# Patient Record
Sex: Male | Born: 1950 | Race: White | Hispanic: No | Marital: Married | Smoking: Former smoker
Health system: Southern US, Community
[De-identification: ages and names within clinical notes are randomized; demographics above are authoritative.]

## PROBLEM LIST (undated history)

## (undated) DIAGNOSIS — K635 Polyp of colon: Secondary | ICD-10-CM

## (undated) DIAGNOSIS — E785 Hyperlipidemia, unspecified: Secondary | ICD-10-CM

## (undated) DIAGNOSIS — I10 Essential (primary) hypertension: Secondary | ICD-10-CM

## (undated) DIAGNOSIS — F329 Major depressive disorder, single episode, unspecified: Secondary | ICD-10-CM

## (undated) DIAGNOSIS — G4700 Insomnia, unspecified: Secondary | ICD-10-CM

## (undated) DIAGNOSIS — F32A Depression, unspecified: Secondary | ICD-10-CM

## (undated) DIAGNOSIS — K219 Gastro-esophageal reflux disease without esophagitis: Secondary | ICD-10-CM

## (undated) HISTORY — DX: Hyperlipidemia, unspecified: E78.5

## (undated) HISTORY — DX: Essential (primary) hypertension: I10

## (undated) HISTORY — DX: Insomnia, unspecified: G47.00

## (undated) HISTORY — DX: Polyp of colon: K63.5

## (undated) HISTORY — DX: Major depressive disorder, single episode, unspecified: F32.9

## (undated) HISTORY — DX: Depression, unspecified: F32.A

## (undated) HISTORY — DX: Gastro-esophageal reflux disease without esophagitis: K21.9

---

## 2005-05-23 ENCOUNTER — Encounter: Payer: Self-pay | Admitting: Internal Medicine

## 2005-05-27 ENCOUNTER — Encounter: Admission: RE | Admit: 2005-05-27 | Discharge: 2005-05-27 | Payer: Self-pay | Admitting: Internal Medicine

## 2005-09-11 ENCOUNTER — Encounter: Admission: RE | Admit: 2005-09-11 | Discharge: 2005-09-11 | Payer: Self-pay | Admitting: Internal Medicine

## 2006-11-04 ENCOUNTER — Ambulatory Visit: Payer: Self-pay | Admitting: Psychiatry

## 2006-11-10 ENCOUNTER — Ambulatory Visit: Payer: Self-pay | Admitting: Psychiatry

## 2006-11-18 ENCOUNTER — Ambulatory Visit: Payer: Self-pay | Admitting: Psychiatry

## 2006-11-24 ENCOUNTER — Ambulatory Visit: Payer: Self-pay | Admitting: Psychiatry

## 2006-12-01 ENCOUNTER — Ambulatory Visit: Payer: Self-pay | Admitting: Psychiatry

## 2006-12-22 ENCOUNTER — Ambulatory Visit: Payer: Self-pay | Admitting: Psychiatry

## 2006-12-29 ENCOUNTER — Ambulatory Visit: Payer: Self-pay | Admitting: Psychiatry

## 2007-01-12 ENCOUNTER — Ambulatory Visit: Payer: Self-pay | Admitting: Psychiatry

## 2007-01-19 ENCOUNTER — Ambulatory Visit: Payer: Self-pay | Admitting: Psychiatry

## 2007-01-26 ENCOUNTER — Ambulatory Visit: Payer: Self-pay | Admitting: Psychiatry

## 2007-02-02 ENCOUNTER — Ambulatory Visit: Payer: Self-pay | Admitting: Psychiatry

## 2007-02-04 ENCOUNTER — Encounter: Admission: RE | Admit: 2007-02-04 | Discharge: 2007-02-04 | Payer: Self-pay | Admitting: Internal Medicine

## 2007-02-14 HISTORY — PX: TOE AMPUTATION: SHX809

## 2008-01-28 ENCOUNTER — Encounter: Admission: RE | Admit: 2008-01-28 | Discharge: 2008-01-28 | Payer: Self-pay | Admitting: Internal Medicine

## 2009-03-23 ENCOUNTER — Ambulatory Visit: Payer: Self-pay | Admitting: Internal Medicine

## 2009-10-18 IMAGING — CR DG TOE GREAT 2+V*R*
1 series · 1 of 1 positions shown · non-contrast
Comparison: 02/04/07.

CLINICAL DATA: Surgery for osteomyelitis in Tuesday January, 2007.  Now with soft tissue swelling and pain. 
 RIGHT GREAT TOE THREE VIEWS:

[view not recorded]
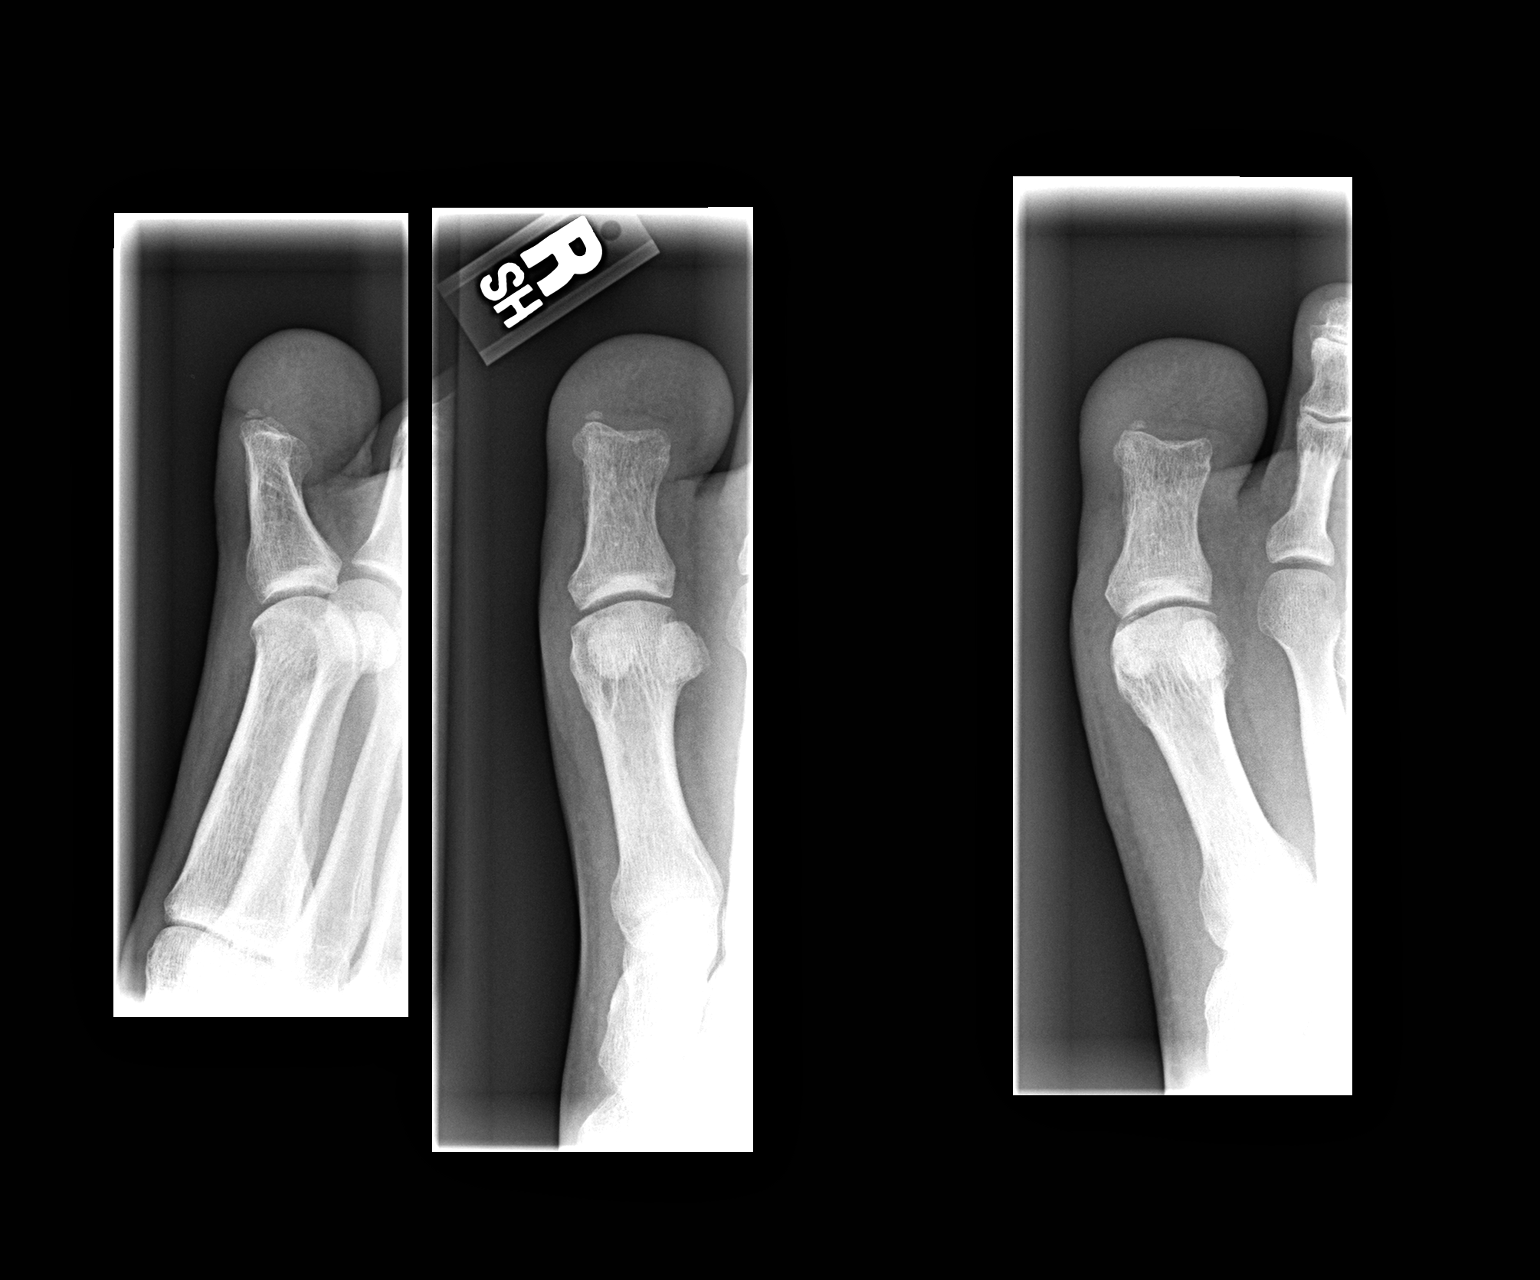

[1 of 1 positions shown; findings below may reference images not displayed]

FINDINGS: Three views of the right toe show amputation of the distal phalanx of the right great toe.  There is soft tissue swelling of the distal right great toe but no bony destruction or erosion is seen.  There is suggestion of perhaps slight demineralization of the distal phalanx and early osteomyelitis cannot be excluded.  Three phase bone scan may be helpful to assess more sensitively in view of the soft tissue swelling present.
IMPRESSION: No definite plain film evidence of osteomyelitis but with faint demineralization and soft tissue swelling early osteomyelitis cannot be excluded.  Consider three phase bone scan.

## 2009-10-27 ENCOUNTER — Ambulatory Visit: Payer: Self-pay | Admitting: Internal Medicine

## 2009-10-30 ENCOUNTER — Ambulatory Visit: Payer: Self-pay | Admitting: Internal Medicine

## 2010-06-04 ENCOUNTER — Ambulatory Visit: Payer: Self-pay | Admitting: Internal Medicine

## 2010-12-06 ENCOUNTER — Ambulatory Visit: Payer: Self-pay | Admitting: Internal Medicine

## 2011-03-21 ENCOUNTER — Ambulatory Visit (INDEPENDENT_AMBULATORY_CARE_PROVIDER_SITE_OTHER): Payer: BC Managed Care – PPO | Admitting: Internal Medicine

## 2011-03-21 DIAGNOSIS — F3289 Other specified depressive episodes: Secondary | ICD-10-CM

## 2011-03-21 DIAGNOSIS — K219 Gastro-esophageal reflux disease without esophagitis: Secondary | ICD-10-CM

## 2011-03-21 DIAGNOSIS — F329 Major depressive disorder, single episode, unspecified: Secondary | ICD-10-CM

## 2011-04-28 ENCOUNTER — Other Ambulatory Visit: Payer: Self-pay | Admitting: Internal Medicine

## 2011-05-06 ENCOUNTER — Encounter: Payer: Self-pay | Admitting: Gastroenterology

## 2011-05-06 ENCOUNTER — Ambulatory Visit (INDEPENDENT_AMBULATORY_CARE_PROVIDER_SITE_OTHER): Payer: BC Managed Care – PPO | Admitting: Gastroenterology

## 2011-05-06 VITALS — BP 148/86 | HR 108 | Ht 73.0 in | Wt 211.0 lb

## 2011-05-06 DIAGNOSIS — K219 Gastro-esophageal reflux disease without esophagitis: Secondary | ICD-10-CM

## 2011-05-06 DIAGNOSIS — Z1211 Encounter for screening for malignant neoplasm of colon: Secondary | ICD-10-CM

## 2011-05-06 MED ORDER — PEG-KCL-NACL-NASULF-NA ASC-C 100 G PO SOLR: 1.0000 | Freq: Once | ORAL | Status: AC

## 2011-05-06 NOTE — Patient Instructions (Addendum)
You have been scheduled for a Upper Endoscopy and Colonoscopy with separate instructions given. Pick up your prep from your pharmacy.  Patient advised to avoid spicy, acidic, citrus, chocolate, mints, fruit and fruit juices.  Limit the intake of caffeine, alcohol and Soda.  Don't exercise too soon after eating.  Don't lie down within 3-4 hours of eating.  Elevate the head of your bed. cc: Sharlet Salina, MD

## 2011-05-06 NOTE — Progress Notes (Signed)
History of Present Illness: This is a 60 year old male who has had intermittent problems with reflux symptoms over the years. Several weeks ago his symptoms substantially worsened and were associated with epigastric pain and heartburn. He was placed on Protonix daily by Dr. Lenord Fellers and his symptoms have substantially improved. He previously underwent colonoscopy 2004 showing multiple hyperplastic colon polyps. He denies nausea, vomiting, weight loss, dysphagia, odynophagia, change in bowel habits, melena, hematochezia.  Past Medical History  Diagnosis Date  . Depression   . Hyperlipidemia   . Insomnia   . Colon polyp   . Hypertension    Past Surgical History  Procedure Date  . Toe amputation 02/2007    partial of right great toe    reports that he has been smoking.  He has never used smokeless tobacco. He reports that he does not drink alcohol or use illicit drugs. family history includes Cancer in his mother and Heart disease in his father. Allergies  Allergen Reactions  . Codeine    Outpatient Encounter Prescriptions as of 05/06/2011  Medication Sig Dispense Refill  . atorvastatin (LIPITOR) 20 MG tablet Take 20 mg by mouth daily.        Marland Kitchen buPROPion (WELLBUTRIN XL) 300 MG 24 hr tablet Take 300 mg by mouth daily.        . diazepam (VALIUM) 10 MG tablet Take 10 mg by mouth every 6 (six) hours as needed.        . pantoprazole (PROTONIX) 40 MG tablet Take 40 mg by mouth daily.        . peg 3350 powder (MOVIPREP) 100 G SOLR Take 1 kit (100 g total) by mouth once.  1 kit  0    ROS: As in HPI. All other systems negative.   Physical Exam: General: Well developed , well nourished, no acute distress Head: Normocephalic and atraumatic Eyes:  sclerae anicteric, EOMI Ears: Normal auditory acuity Mouth: No deformity or lesions Lungs: Clear throughout to auscultation Heart: Regular rate and rhythm; no murmurs, rubs or bruits Abdomen: Soft, non tender and non distended. No masses,  hepatosplenomegaly or hernias noted. Normal Bowel sounds Rectal: Deferred to colonoscopy Musculoskeletal: Symmetrical with no gross deformities  Pulses:  Normal pulses noted Extremities: No clubbing, cyanosis, edema or deformities noted Neurological: Alert oriented x 4, grossly nonfocal Psychological:  Alert and cooperative. Normal mood and affect  Assessment and Recommendations:  1. GERD. Rule out esophagitis and ulcer disease. Standard antireflux measures. Continue Protonix every morning. Schedule upper endoscopy. The risks, benefits, and alternatives to endoscopy with possible biopsy and possible dilation were discussed with the patient and they consent to proceed.   2. Colorectal cancer screening. Multiple hyperplastic colon polyps on last colonoscopy. Schedule colonoscopy. The risks, benefits, and alternatives to colonoscopy with possible biopsy and possible polypectomy were discussed with the patient and they consent to proceed.

## 2011-05-07 ENCOUNTER — Encounter: Payer: Self-pay | Admitting: Gastroenterology

## 2011-05-09 ENCOUNTER — Telehealth: Payer: Self-pay | Admitting: Gastroenterology

## 2011-05-09 MED ORDER — PEG-KCL-NACL-NASULF-NA ASC-C 100 G PO SOLR: 1.0000 | Freq: Once | ORAL | Status: AC

## 2011-05-09 NOTE — Telephone Encounter (Signed)
Movi prep resent to [facility] pharmacy.

## 2011-05-21 ENCOUNTER — Other Ambulatory Visit: Payer: Self-pay

## 2011-05-21 MED ORDER — ATORVASTATIN CALCIUM 20 MG PO TABS: 20.0000 mg | ORAL_TABLET | Freq: Every day | ORAL | Status: DC

## 2011-06-13 ENCOUNTER — Encounter: Payer: BC Managed Care – PPO | Admitting: Gastroenterology

## 2011-06-25 ENCOUNTER — Encounter: Payer: Self-pay | Admitting: Gastroenterology

## 2011-06-25 ENCOUNTER — Ambulatory Visit (AMBULATORY_SURGERY_CENTER): Payer: BC Managed Care – PPO | Admitting: Gastroenterology

## 2011-06-25 DIAGNOSIS — Z86010 Personal history of colonic polyps: Secondary | ICD-10-CM

## 2011-06-25 DIAGNOSIS — K5730 Diverticulosis of large intestine without perforation or abscess without bleeding: Secondary | ICD-10-CM

## 2011-06-25 DIAGNOSIS — K219 Gastro-esophageal reflux disease without esophagitis: Secondary | ICD-10-CM

## 2011-06-25 DIAGNOSIS — D126 Benign neoplasm of colon, unspecified: Secondary | ICD-10-CM

## 2011-06-25 DIAGNOSIS — Z1211 Encounter for screening for malignant neoplasm of colon: Secondary | ICD-10-CM

## 2011-06-25 MED ORDER — SODIUM CHLORIDE 0.9 % IV SOLN: 500.0000 mL | INTRAVENOUS | Status: DC

## 2011-06-25 NOTE — Patient Instructions (Signed)
Dis charge instructions given with verbal understanding. Handouts on polyps , diverticulosis and a hiatal hernia given. Hold aspirin and anything containing aspirin for 2 weeks. Resume previous medications.

## 2011-06-26 ENCOUNTER — Telehealth: Payer: Self-pay

## 2011-06-26 NOTE — Telephone Encounter (Signed)

## 2011-07-02 ENCOUNTER — Encounter: Payer: Self-pay | Admitting: Gastroenterology

## 2011-09-29 ENCOUNTER — Other Ambulatory Visit: Payer: Self-pay | Admitting: Internal Medicine

## 2012-03-28 ENCOUNTER — Other Ambulatory Visit: Payer: Self-pay | Admitting: Internal Medicine

## 2012-04-23 ENCOUNTER — Other Ambulatory Visit: Payer: Self-pay | Admitting: Internal Medicine

## 2012-05-21 ENCOUNTER — Other Ambulatory Visit: Payer: Self-pay | Admitting: Internal Medicine

## 2012-09-17 ENCOUNTER — Ambulatory Visit (INDEPENDENT_AMBULATORY_CARE_PROVIDER_SITE_OTHER): Payer: BC Managed Care – PPO | Admitting: Internal Medicine

## 2012-09-17 ENCOUNTER — Encounter: Payer: Self-pay | Admitting: Internal Medicine

## 2012-09-17 VITALS — BP 148/88 | HR 92 | Ht 71.5 in | Wt 219.0 lb

## 2012-09-17 DIAGNOSIS — F329 Major depressive disorder, single episode, unspecified: Secondary | ICD-10-CM

## 2012-09-17 DIAGNOSIS — K219 Gastro-esophageal reflux disease without esophagitis: Secondary | ICD-10-CM

## 2012-09-17 DIAGNOSIS — E785 Hyperlipidemia, unspecified: Secondary | ICD-10-CM

## 2012-09-17 DIAGNOSIS — G4700 Insomnia, unspecified: Secondary | ICD-10-CM | POA: Insufficient documentation

## 2012-09-17 DIAGNOSIS — Z125 Encounter for screening for malignant neoplasm of prostate: Secondary | ICD-10-CM

## 2012-09-17 DIAGNOSIS — R030 Elevated blood-pressure reading, without diagnosis of hypertension: Secondary | ICD-10-CM

## 2012-09-17 LAB — POCT URINALYSIS DIPSTICK
Blood, UA: NEGATIVE
Glucose, UA: NEGATIVE
Nitrite, UA: NEGATIVE
Protein, UA: NEGATIVE
Spec Grav, UA: >=1.03
pH, UA: 5.5

## 2012-09-17 LAB — CBC WITH DIFFERENTIAL/PLATELET
Basophils Absolute: 0 10*3/uL (ref 0.0–0.1)
Hemoglobin: 15.4 g/dL (ref 13.0–17.0)
MCH: 28.9 pg (ref 26.0–34.0)
MCHC: 34.2 g/dL (ref 30.0–36.0)
MCV: 84.4 fL (ref 78.0–100.0)
Neutro Abs: 5.4 10*3/uL (ref 1.7–7.7)
Neutrophils Relative %: 57 % (ref 43–77)
Platelets: 284 10*3/uL (ref 150–400)
RBC: 5.33 MIL/uL (ref 4.22–5.81)
RDW: 14.7 % (ref 11.5–15.5)

## 2012-09-17 NOTE — Patient Instructions (Addendum)
Continue off Wellbutrin. Keep watch on blood pressure and call me if persistently elevated greater than 140/90.

## 2012-09-18 LAB — COMPREHENSIVE METABOLIC PANEL
AST: 17 U/L (ref 0–37)
BUN: 20 mg/dL (ref 6–23)
CO2: 22 mEq/L (ref 19–32)
Calcium: 9.1 mg/dL (ref 8.4–10.5)
Glucose, Bld: 84 mg/dL (ref 70–99)
Total Bilirubin: 0.4 mg/dL (ref 0.3–1.2)
Total Protein: 6.9 g/dL (ref 6.0–8.3)

## 2012-09-18 LAB — LIPID PANEL: HDL: 33 mg/dL — ABNORMAL LOW (ref >39)

## 2013-02-28 DIAGNOSIS — F32A Depression, unspecified: Secondary | ICD-10-CM | POA: Insufficient documentation

## 2013-02-28 DIAGNOSIS — F329 Major depressive disorder, single episode, unspecified: Secondary | ICD-10-CM | POA: Insufficient documentation

## 2013-02-28 NOTE — Progress Notes (Signed)
  Subjective:    Patient ID: Juan Garcia, male    DOB: September 12, 1951, 62 y.o.   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 Takes l generic Lipitor for hyperlipidemia. Issues with insomnia have been worse recently. Rx given for Zostavax vaccin to be obtained at pharmacy. Declines flu vaccine.  Intolerant of codeine Colonoscopy 2004 by Dr. Russella Dar and repeated in 2012 Tetanus immunization 2004 Hx partial amputation Right great toe due to osteomyelitis 3/08. Had cellulitis from apparent insect bite that progressed to osteomyelitis.  SHx: married. Wife is retired Tourist information centre manager. One adult son. Continues to smoke and has smoked for over 30 years although consumption has decreased. Social alcohol consumption.    Review of Systems  Constitutional: Negative.   HENT: Negative.   Eyes: Negative.   Respiratory: Negative.   Cardiovascular: Negative.   Gastrointestinal:       GERD  Endocrine: Negative.   Genitourinary: Negative.   Allergic/Immunologic: Negative.   Neurological: Negative.   Psychiatric/Behavioral:       Insomnia; history of depression related to post traumatic stress form [place] war       Objective:   Physical Exam  Vitals reviewed. Constitutional: He is oriented to person, place, and time. He appears well-developed and well-nourished. No distress.  HENT:  Head: Normocephalic and atraumatic.  Right Ear: External ear normal.  Left Ear: External ear normal.  Mouth/Throat: Oropharynx is clear and moist.  Eyes: EOM are normal. Pupils are equal, round, and reactive to light. Right eye exhibits no discharge. Left eye exhibits no discharge. No scleral icterus.  Neck: Neck supple. No JVD present. No thyromegaly present.  Cardiovascular:  Normal rate, normal heart sounds and intact distal pulses.   Pulmonary/Chest: Effort normal. No respiratory distress. He has no wheezes. He has no rales. He exhibits no tenderness.  Abdominal: Bowel sounds are normal. He exhibits no distension and no mass. There is no tenderness. There is no rebound and no guarding.  Genitourinary: Prostate normal.  Musculoskeletal: Normal range of motion. He exhibits no edema.  Lymphadenopathy:    He has no cervical adenopathy.  Neurological: He is alert and oriented to person, place, and time. He has normal reflexes. No cranial nerve deficit. Coordination normal.  Skin: Skin is dry. No rash noted. He is not diaphoretic.  Psychiatric: He has a normal mood and affect. His behavior is normal. Judgment and thought content normal.          Assessment & Plan:  Elevated blood pressure- monitor at home and call if persistently elevated Insomnia- Rx for Restoril 30 mg #30 no refill  Depression- no longer taking Wellbutrin feels he does not need it Hyperlipidemia - treated with generic Lipitor recheck in 6 months GERD- treated with generic Protonix  See in 6 months will need lipid panel and liver functions with OV

## 2013-03-23 ENCOUNTER — Encounter: Payer: Self-pay | Admitting: Internal Medicine

## 2013-03-23 ENCOUNTER — Ambulatory Visit (INDEPENDENT_AMBULATORY_CARE_PROVIDER_SITE_OTHER): Payer: Self-pay | Admitting: Internal Medicine

## 2013-03-23 VITALS — BP 156/94 | HR 92 | Wt 208.0 lb

## 2013-03-23 DIAGNOSIS — I10 Essential (primary) hypertension: Secondary | ICD-10-CM

## 2013-03-23 DIAGNOSIS — E785 Hyperlipidemia, unspecified: Secondary | ICD-10-CM

## 2013-03-23 DIAGNOSIS — Z79899 Other long term (current) drug therapy: Secondary | ICD-10-CM

## 2013-03-23 DIAGNOSIS — B351 Tinea unguium: Secondary | ICD-10-CM

## 2013-03-23 DIAGNOSIS — Z23 Encounter for immunization: Secondary | ICD-10-CM

## 2013-03-23 LAB — HEPATIC FUNCTION PANEL
ALT: 12 U/L (ref 0–53)
AST: 14 U/L (ref 0–37)
Albumin: 4.4 g/dL (ref 3.5–5.2)
Alkaline Phosphatase: 111 U/L (ref 39–117)
Bilirubin, Direct: 0.1 mg/dL (ref 0.0–0.3)
Total Bilirubin: 0.5 mg/dL (ref 0.3–1.2)

## 2013-03-23 LAB — LIPID PANEL
Cholesterol: 200 mg/dL (ref 0–200)
LDL Cholesterol: 152 mg/dL — ABNORMAL HIGH (ref 0–99)
Triglycerides: 76 mg/dL (ref <150)
VLDL: 15 mg/dL (ref 0–40)

## 2013-03-23 MED ORDER — TETANUS-DIPHTH-ACELL PERTUSSIS 5-2.5-18.5 LF-MCG/0.5 IM SUSP: 0.5000 mL | Freq: Once | INTRAMUSCULAR | Status: DC

## 2013-03-29 ENCOUNTER — Other Ambulatory Visit: Payer: Self-pay

## 2013-03-29 MED ORDER — PANTOPRAZOLE SODIUM 40 MG PO TBEC: 40.0000 mg | DELAYED_RELEASE_TABLET | Freq: Every day | ORAL | Status: DC

## 2013-04-17 DIAGNOSIS — B351 Tinea unguium: Secondary | ICD-10-CM | POA: Insufficient documentation

## 2013-04-17 NOTE — Patient Instructions (Addendum)
Return in 6 weeks for followup on onychomycosis therapy with CBC and liver functions. Otherwise will need physical examination in 6 months

## 2013-04-17 NOTE — Progress Notes (Addendum)
  Subjective:    Patient ID: Juan Garcia, male    DOB: 1951-08-07, 62 y.o.   MRN: [id number]  HPI In today to followup on depression and hyperlipidemia. 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 Depression is stable. He is fasting today. Fasting lipid panel liver functions drawn a lipid-lowering medication. Today he has a new problem which is onychomycosis of toenails.    Review of Systems     Objective:   Physical Exam Neck is supple without thyromegaly JVD or carotid bruits. Chest clear to auscultation. Cardiac exam regular rate and rhythm normal S1 and S2. Extremities without edema. Onychomycosis of toenails      Assessment & Plan:  Depression  Hyperlipidemia  Onychomycosis of toenails  Hypertension  Plan: Start amlodipine 5 mg daily for hypertension and return in 4 weeks. Continue statin medication and Wellbutrin for depression.  Lamisil 250 mg daily for 6 weeks. Must return then for CBC and liver functions and office visit. Told him it would take 12 weeks of Lamisil to see an improvement in only about 80% of patients respond to Lamisil.

## 2013-04-27 ENCOUNTER — Ambulatory Visit: Payer: BC Managed Care – PPO | Admitting: Internal Medicine

## 2013-04-29 ENCOUNTER — Ambulatory Visit (INDEPENDENT_AMBULATORY_CARE_PROVIDER_SITE_OTHER): Payer: BC Managed Care – PPO | Admitting: Internal Medicine

## 2013-04-29 ENCOUNTER — Encounter: Payer: Self-pay | Admitting: Internal Medicine

## 2013-04-29 VITALS — BP 140/74 | Temp 99.0°F | Wt 206.0 lb

## 2013-04-29 DIAGNOSIS — I10 Essential (primary) hypertension: Secondary | ICD-10-CM

## 2013-04-29 DIAGNOSIS — Z79899 Other long term (current) drug therapy: Secondary | ICD-10-CM

## 2013-04-29 LAB — HEPATIC FUNCTION PANEL
ALT: 24 U/L (ref 0–53)
AST: 17 U/L (ref 0–37)
Albumin: 3.7 g/dL (ref 3.5–5.2)
Alkaline Phosphatase: 96 U/L (ref 39–117)
Total Protein: 6.7 g/dL (ref 6.0–8.3)

## 2013-04-29 LAB — CBC WITH DIFFERENTIAL/PLATELET
Eosinophils Absolute: 0.1 10*3/uL (ref 0.0–0.7)
Eosinophils Relative: 1 % (ref 0–5)
Hemoglobin: 13.5 g/dL (ref 13.0–17.0)
Lymphs Abs: 2.4 10*3/uL (ref 0.7–4.0)
MCH: 29 pg (ref 26.0–34.0)
MCV: 82.2 fL (ref 78.0–100.0)
Monocytes Absolute: 0.9 10*3/uL (ref 0.1–1.0)
Monocytes Relative: 7 % (ref 3–12)
RBC: 4.66 MIL/uL (ref 4.22–5.81)

## 2013-05-02 DIAGNOSIS — I10 Essential (primary) hypertension: Secondary | ICD-10-CM | POA: Insufficient documentation

## 2013-05-02 NOTE — Addendum Note (Signed)
Addended by: Margaree Mackintosh on: 05/02/2013 04:09 PM   Modules accepted: Medications

## 2013-05-02 NOTE — Patient Instructions (Addendum)
Continue Lamisil 250 mg daily to complete a 12 week course. For bronchitis, Levaquin 500 milligrams daily for 7 days. Continue amlodipine 5 mg daily. Start Cozaar 50 mg daily and return in 4 -6 weeks

## 2013-05-02 NOTE — Progress Notes (Signed)
  Subjective:    Patient ID: Juan Garcia, male    DOB: 01-17-1951, 62 y.o.   MRN: [id number]  HPI Was started at last visit on amlodipine 5 mg daily for hypertension. Blood pressure has improved but is still elevated. If I  increase his amlodipine, his feet might  swell. 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 With regard to onychomycosis, Lamisil seems to be working. Went ahead and drew CBC and liver functions today in followup of Lamisil therapy. Also he took a group of children to [place] for a field trip and came back with a cough that has persisted. Cough is productive. No fever or shaking chills    Review of Systems     Objective:   Physical Exam toenails are beginning to grow out and seem to be normal at site of new growth. Chest is clear to auscultation without rales or wheezing. Neck is supple without JVD thyromegaly or carotid bruits. Cardiac exam regular rate and rhythm normal S1 and S2. Extremities without edema.        Assessment & Plan:  Bronchitis  Hypertension  Onychomycosis  Plan: Continue Lamisil 250 mg daily to complete a total treatment of 3 months. For bronchitis, Levaquin 500 milligrams daily for 7 days. For hypertension, continue amlodipine 5 mg daily and add Cozaar 50 mg daily. Return in 4-6 weeks for blood pressure check and basic metabolic panel   spent 25 minutes with patient evaluated in 3 medical issues

## 2013-05-31 ENCOUNTER — Ambulatory Visit (INDEPENDENT_AMBULATORY_CARE_PROVIDER_SITE_OTHER): Payer: BC Managed Care – PPO | Admitting: Internal Medicine

## 2013-05-31 ENCOUNTER — Encounter: Payer: Self-pay | Admitting: Internal Medicine

## 2013-05-31 VITALS — BP 132/84 | HR 84 | Wt 208.0 lb

## 2013-05-31 DIAGNOSIS — I10 Essential (primary) hypertension: Secondary | ICD-10-CM

## 2013-06-01 LAB — BASIC METABOLIC PANEL
CO2: 24 mEq/L (ref 19–32)
Calcium: 9.1 mg/dL (ref 8.4–10.5)
Creat: 0.67 mg/dL (ref 0.50–1.35)

## 2013-06-24 ENCOUNTER — Encounter: Payer: Self-pay | Admitting: Internal Medicine

## 2013-06-24 ENCOUNTER — Ambulatory Visit (INDEPENDENT_AMBULATORY_CARE_PROVIDER_SITE_OTHER): Payer: BC Managed Care – PPO | Admitting: Internal Medicine

## 2013-06-24 VITALS — BP 126/78 | HR 80 | Wt 211.0 lb

## 2013-06-24 DIAGNOSIS — B351 Tinea unguium: Secondary | ICD-10-CM

## 2013-06-24 DIAGNOSIS — I10 Essential (primary) hypertension: Secondary | ICD-10-CM

## 2013-06-24 DIAGNOSIS — Z79899 Other long term (current) drug therapy: Secondary | ICD-10-CM

## 2013-06-24 LAB — HEPATIC FUNCTION PANEL
AST: 16 U/L (ref 0–37)
Albumin: 4.5 g/dL (ref 3.5–5.2)
Alkaline Phosphatase: 101 U/L (ref 39–117)
Total Bilirubin: 0.4 mg/dL (ref 0.3–1.2)

## 2013-06-24 LAB — CBC WITH DIFFERENTIAL/PLATELET
Basophils Absolute: 0 10*3/uL (ref 0.0–0.1)
Basophils Relative: 0 % (ref 0–1)
Hemoglobin: 14.7 g/dL (ref 13.0–17.0)
MCHC: 34.2 g/dL (ref 30.0–36.0)
Monocytes Relative: 7 % (ref 3–12)
Neutro Abs: 5.9 10*3/uL (ref 1.7–7.7)
Neutrophils Relative %: 59 % (ref 43–77)
RBC: 5.22 MIL/uL (ref 4.22–5.81)

## 2013-06-26 ENCOUNTER — Encounter: Payer: Self-pay | Admitting: Internal Medicine

## 2013-06-26 NOTE — Progress Notes (Signed)
  Subjective:    Patient ID: Juan Garcia, male    DOB: 10/05/1951, 62 y.o.   MRN: [id number]  HPI   At last  visit losartan was increased from 50 to 100 mg daily. Blood pressure is better controlled with this regimen. No complaints or problems on this medication. Feels well. Just got back from the beach. Feels relaxed. He is taking Lamisil for onychomycosis.    Review of Systems     Objective:   Physical Exam Neck is supple without JVD thyromegaly or carotid bruits. Chest clear to auscultation. Cardiac exam regular rate and rhythm normal S1 and S2. Extremities without edema.        Assessment & Plan:  Hypertension-better controlled with losartan 100 mg daily and amlodipine 5 mg daily  Plan: Return Fall 2014 for physical examination.

## 2013-06-26 NOTE — Patient Instructions (Addendum)
Continue losartan 100 mg daily and amlodipine 5 mg daily. Return Fall 2014 for physical exam

## 2013-06-26 NOTE — Patient Instructions (Addendum)
Continue amlodipine 5 mg daily. Increased losartan from 50-100 mg daily and return in 4 weeks.

## 2013-06-26 NOTE — Progress Notes (Signed)
  Subjective:    Patient ID: Juan Garcia, male    DOB: Aug 11, 1951, 62 y.o.   MRN: [id number]  HPI 62 year old male in today for followup of hypertension. Is on amlodipine 5 mg daily and losartan 50 mg daily. Blood pressure control could be better. B-met is within normal limits. No problems with these 2 medications.    Review of Systems     Objective:   Physical Exam Skin is warm and dry. Nodes none. Neck supple without thyromegaly. No JVD or carotid bruits. Chest clear to auscultation. Cardiac exam regular rate and rhythm normal S1 and S2. Extremities without edema.       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 Continue amlodipine 5 mg daily. Return in 4 weeks.

## 2013-08-19 ENCOUNTER — Other Ambulatory Visit: Payer: Self-pay

## 2013-08-19 MED ORDER — LOSARTAN POTASSIUM 50 MG PO TABS: 50.0000 mg | ORAL_TABLET | Freq: Every day | ORAL | Status: DC

## 2013-08-25 ENCOUNTER — Telehealth: Payer: Self-pay | Admitting: Internal Medicine

## 2013-08-26 ENCOUNTER — Telehealth: Payer: Self-pay | Admitting: Internal Medicine

## 2013-08-26 MED ORDER — BUPROPION HCL ER (XL) 300 MG PO TB24: 300.0000 mg | ORAL_TABLET | Freq: Every day | ORAL | Status: DC

## 2013-08-26 NOTE — Telephone Encounter (Signed)
Patient called back to inquire on Wellbutrin prescription.  Dr. Lenord Fellers advised she has just sent e-scribe to his pharmacy.

## 2013-08-26 NOTE — Telephone Encounter (Signed)
It was ordered at last visit. What is the issue?

## 2013-08-26 NOTE — Telephone Encounter (Signed)
Pt's wife calls and asks he be restarted on Wellbutrin. At last visit, he asked to restrat it for depression. I thought it had been ordered. Will re- order today.

## 2013-08-27 NOTE — Telephone Encounter (Signed)
Dr Lenord Fellers e-scribed for patient.

## 2013-09-21 ENCOUNTER — Other Ambulatory Visit: Payer: Self-pay

## 2013-09-21 MED ORDER — ATORVASTATIN CALCIUM 20 MG PO TABS: 20.0000 mg | ORAL_TABLET | Freq: Every day | ORAL | Status: DC

## 2013-09-23 ENCOUNTER — Other Ambulatory Visit: Payer: Self-pay

## 2013-09-23 MED ORDER — AMLODIPINE BESYLATE 5 MG PO TABS: 5.0000 mg | ORAL_TABLET | Freq: Every day | ORAL | Status: DC

## 2013-09-30 ENCOUNTER — Ambulatory Visit: Payer: BC Managed Care – PPO | Admitting: Internal Medicine

## 2013-12-03 ENCOUNTER — Other Ambulatory Visit: Payer: Self-pay | Admitting: Internal Medicine

## 2013-12-03 DIAGNOSIS — Z13220 Encounter for screening for lipoid disorders: Secondary | ICD-10-CM

## 2013-12-03 DIAGNOSIS — N400 Enlarged prostate without lower urinary tract symptoms: Secondary | ICD-10-CM

## 2013-12-03 DIAGNOSIS — Z0000 Encounter for general adult medical examination without abnormal findings: Secondary | ICD-10-CM

## 2013-12-03 DIAGNOSIS — Z130 Encounter for screening for diseases of the blood and blood-forming organs and certain disorders involving the immune mechanism: Secondary | ICD-10-CM

## 2013-12-07 ENCOUNTER — Ambulatory Visit: Payer: BC Managed Care – PPO | Admitting: Internal Medicine

## 2013-12-13 ENCOUNTER — Other Ambulatory Visit: Payer: BC Managed Care – PPO | Admitting: Internal Medicine

## 2013-12-13 DIAGNOSIS — Z79899 Other long term (current) drug therapy: Secondary | ICD-10-CM

## 2013-12-13 DIAGNOSIS — I10 Essential (primary) hypertension: Secondary | ICD-10-CM

## 2013-12-13 DIAGNOSIS — E785 Hyperlipidemia, unspecified: Secondary | ICD-10-CM

## 2013-12-13 LAB — BASIC METABOLIC PANEL
BUN: 14 mg/dL (ref 6–23)
Creat: 0.9 mg/dL (ref 0.50–1.35)
Potassium: 4.3 mEq/L (ref 3.5–5.3)

## 2013-12-13 LAB — LIPID PANEL
LDL Cholesterol: 131 mg/dL — ABNORMAL HIGH (ref 0–99)
Total CHOL/HDL Ratio: 5 Ratio
VLDL: 17 mg/dL (ref 0–40)

## 2013-12-13 LAB — HEPATIC FUNCTION PANEL
AST: 16 U/L (ref 0–37)
Bilirubin, Direct: 0.1 mg/dL (ref 0.0–0.3)
Total Bilirubin: 0.4 mg/dL (ref 0.3–1.2)

## 2013-12-14 ENCOUNTER — Encounter: Payer: Self-pay | Admitting: Internal Medicine

## 2013-12-14 ENCOUNTER — Ambulatory Visit (INDEPENDENT_AMBULATORY_CARE_PROVIDER_SITE_OTHER): Payer: BC Managed Care – PPO | Admitting: Internal Medicine

## 2013-12-14 VITALS — BP 128/80 | HR 76 | Temp 98.6°F | Wt 208.0 lb

## 2013-12-14 DIAGNOSIS — I10 Essential (primary) hypertension: Secondary | ICD-10-CM

## 2013-12-14 DIAGNOSIS — E785 Hyperlipidemia, unspecified: Secondary | ICD-10-CM

## 2013-12-14 NOTE — Patient Instructions (Addendum)
Continue diet and exercise. Return summer 2015 for physical exam. Continue same medications

## 2013-12-14 NOTE — Progress Notes (Signed)
   Subjective:    Patient ID: Juan Garcia, male    DOB: 02-Aug-1951, 62 y.o.   MRN: [id number]  HPI  In today for followup of hypertension and hyperlipidemia. Declines take flu shot. Has lost 3 pounds since July. Says he's been eating less. Feels better since she's lost a bit of weight. Says he's actually lost more than what we have recorded recently. In October 2013 he weighed 219 pounds. Now weighs 208 pounds.    Review of Systems     Objective:   Physical Exam Neck is supple without JVD thyromegaly or carotid bruits. Chest clear to auscultation. Cardiac exam regular rate and rhythm normal S1 and S2. Extremities without edema        Assessment & Plan:  Hypertension  Hyperlipidemia  Plan: B- met stable. Lipids have improved. Liver functions within normal limits. Lab results reviewed with him today. Continue same medication return for physical exam Summer 2015

## 2014-02-11 ENCOUNTER — Other Ambulatory Visit: Payer: Self-pay

## 2014-02-11 MED ORDER — BUPROPION HCL ER (XL) 300 MG PO TB24: 300.0000 mg | ORAL_TABLET | Freq: Every day | ORAL | Status: DC

## 2014-03-08 ENCOUNTER — Other Ambulatory Visit: Payer: Self-pay

## 2014-03-08 MED ORDER — ATORVASTATIN CALCIUM 20 MG PO TABS: 20.0000 mg | ORAL_TABLET | Freq: Every day | ORAL | Status: DC

## 2014-03-08 MED ORDER — LOSARTAN POTASSIUM 50 MG PO TABS: 50.0000 mg | ORAL_TABLET | Freq: Every day | ORAL | Status: DC

## 2014-03-08 MED ORDER — AMLODIPINE BESYLATE 5 MG PO TABS: 5.0000 mg | ORAL_TABLET | Freq: Every day | ORAL | Status: DC

## 2014-04-19 ENCOUNTER — Encounter: Payer: Self-pay | Admitting: Gastroenterology

## 2014-06-21 ENCOUNTER — Encounter (INDEPENDENT_AMBULATORY_CARE_PROVIDER_SITE_OTHER): Payer: Medicare Other | Admitting: Internal Medicine

## 2014-06-21 ENCOUNTER — Encounter: Payer: Self-pay | Admitting: Internal Medicine

## 2014-06-21 VITALS — BP 140/86 | HR 76 | Temp 97.3°F | Ht 72.25 in | Wt 210.0 lb

## 2014-06-21 DIAGNOSIS — Z0000 Encounter for general adult medical examination without abnormal findings: Secondary | ICD-10-CM | POA: Diagnosis not present

## 2014-06-21 LAB — POCT URINALYSIS DIPSTICK
BILIRUBIN UA: NEGATIVE
Glucose, UA: NEGATIVE
Ketones, UA: NEGATIVE
Leukocytes, UA: NEGATIVE
Nitrite, UA: NEGATIVE
Protein, UA: NEGATIVE
RBC UA: NEGATIVE
SPEC GRAV UA: 1.015
Urobilinogen, UA: NEGATIVE
pH, UA: 6.5

## 2014-09-11 NOTE — Progress Notes (Signed)
   Subjective:    Patient ID: Juan Garcia, male    DOB: Apr 10, 1951, 63 y.o.   MRN: [id number]  HPI Appt was canceled because patient does not have Medicare coverage worked out    Review of Systems     Objective:   Physical Exam        Assessment & Plan:

## 2014-09-26 ENCOUNTER — Encounter: Payer: Self-pay | Admitting: Internal Medicine

## 2014-09-26 ENCOUNTER — Other Ambulatory Visit: Payer: Self-pay

## 2014-09-26 ENCOUNTER — Ambulatory Visit (INDEPENDENT_AMBULATORY_CARE_PROVIDER_SITE_OTHER): Payer: BC Managed Care – PPO | Admitting: Internal Medicine

## 2014-09-26 ENCOUNTER — Other Ambulatory Visit: Payer: Medicare Other | Admitting: Internal Medicine

## 2014-09-26 VITALS — BP 130/78 | HR 88 | Ht 73.0 in | Wt 215.0 lb

## 2014-09-26 DIAGNOSIS — Z125 Encounter for screening for malignant neoplasm of prostate: Secondary | ICD-10-CM | POA: Diagnosis not present

## 2014-09-26 DIAGNOSIS — Z23 Encounter for immunization: Secondary | ICD-10-CM

## 2014-09-26 DIAGNOSIS — Z8659 Personal history of other mental and behavioral disorders: Secondary | ICD-10-CM

## 2014-09-26 DIAGNOSIS — R030 Elevated blood-pressure reading, without diagnosis of hypertension: Secondary | ICD-10-CM | POA: Diagnosis not present

## 2014-09-26 DIAGNOSIS — Z0000 Encounter for general adult medical examination without abnormal findings: Secondary | ICD-10-CM | POA: Diagnosis not present

## 2014-09-26 DIAGNOSIS — E785 Hyperlipidemia, unspecified: Secondary | ICD-10-CM

## 2014-09-26 MED ORDER — BUPROPION HCL ER (XL) 300 MG PO TB24: 300.0000 mg | ORAL_TABLET | Freq: Every day | ORAL | Status: DC

## 2014-09-26 MED ORDER — AMLODIPINE BESYLATE 5 MG PO TABS: 5.0000 mg | ORAL_TABLET | Freq: Every day | ORAL | Status: DC

## 2014-09-26 MED ORDER — LOSARTAN POTASSIUM 50 MG PO TABS: 50.0000 mg | ORAL_TABLET | Freq: Every day | ORAL | Status: DC

## 2014-09-26 MED ORDER — PNEUMOCOCCAL VAC POLYVALENT 25 MCG/0.5ML IJ INJ: 0.5000 mL | INJECTION | Freq: Once | INTRAMUSCULAR | Status: DC

## 2014-09-26 MED ORDER — ATORVASTATIN CALCIUM 20 MG PO TABS: 20.0000 mg | ORAL_TABLET | Freq: Every day | ORAL | Status: DC

## 2014-09-26 NOTE — Patient Instructions (Addendum)
Continue same medications and return in 6 months. Pneumonia and flu vaccines given today. Order given for shingles vaccine at pharmacy

## 2014-09-27 LAB — CBC WITH DIFFERENTIAL/PLATELET
BASOS PCT: 0 % (ref 0–1)
Basophils Absolute: 0 10*3/uL (ref 0.0–0.1)
EOS PCT: 3 % (ref 0–5)
Eosinophils Absolute: 0.3 10*3/uL (ref 0.0–0.7)
HCT: 45 % (ref 39.0–52.0)
Hemoglobin: 15.7 g/dL (ref 13.0–17.0)
LYMPHS ABS: 2.9 10*3/uL (ref 0.7–4.0)
Lymphocytes Relative: 32 % (ref 12–46)
MCH: 29.2 pg (ref 26.0–34.0)
MCHC: 34.9 g/dL (ref 30.0–36.0)
MCV: 83.6 fL (ref 78.0–100.0)
MONO ABS: 0.7 10*3/uL (ref 0.1–1.0)
Monocytes Relative: 8 % (ref 3–12)
Neutro Abs: 5.2 10*3/uL (ref 1.7–7.7)
Neutrophils Relative %: 57 % (ref 43–77)
Platelets: 269 10*3/uL (ref 150–400)
RBC: 5.38 MIL/uL (ref 4.22–5.81)
RDW: 14.8 % (ref 11.5–15.5)
WBC: 9.1 10*3/uL (ref 4.0–10.5)

## 2014-09-27 LAB — COMPREHENSIVE METABOLIC PANEL
ALK PHOS: 113 U/L (ref 39–117)
ALT: 15 U/L (ref 0–53)
AST: 15 U/L (ref 0–37)
Albumin: 4.2 g/dL (ref 3.5–5.2)
BILIRUBIN TOTAL: 0.4 mg/dL (ref 0.2–1.2)
BUN: 12 mg/dL (ref 6–23)
CO2: 22 mEq/L (ref 19–32)
CREATININE: 0.86 mg/dL (ref 0.50–1.35)
Calcium: 9.1 mg/dL (ref 8.4–10.5)
Chloride: 107 mEq/L (ref 96–112)
Glucose, Bld: 100 mg/dL — ABNORMAL HIGH (ref 70–99)
Potassium: 4.5 mEq/L (ref 3.5–5.3)
SODIUM: 141 meq/L (ref 135–145)
Total Protein: 6.9 g/dL (ref 6.0–8.3)

## 2014-09-27 LAB — LIPID PANEL
CHOL/HDL RATIO: 6 ratio
Cholesterol: 215 mg/dL — ABNORMAL HIGH (ref 0–200)
HDL: 36 mg/dL — ABNORMAL LOW (ref >39)
LDL CALC: 158 mg/dL — AB (ref 0–99)
Triglycerides: 106 mg/dL (ref <150)
VLDL: 21 mg/dL (ref 0–40)

## 2014-09-27 LAB — PSA: PSA: 3.21 ng/mL (ref <=4.00)

## 2014-11-13 NOTE — Progress Notes (Signed)
Subjective:    Patient ID: Juan Garcia, male    DOB: 09/24/1951, 63 y.o.   MRN: [id number]  HPI 63 year old white male in today for Welcome to Medicare examination. History of depression and GE reflux. Takes Lipitor for hyperlipidemia. Issues with insomnia.  Intolerant of codeine. Colonoscopy 2012 by Dr. Fuller Plan.  History of partial dictation right great toe due to osteomyelitis 2008. Had cellulitis from apparent insect bite that progressed osteomyelitis.  Social history: Wife is a retired Chief Technology Officer. He is a retired Radio producer. One adult son. Continues to smoke and is smoked for over 30 years although consumption has decreased. Social alcohol consumption.  Family history: Mother died of cancer at age 98. Father with history of triple bypass surgery died of congestive heart failure at 67. 2 sisters both overweight. No brothers.    Review of Systems history of depression related to posttraumatic stress from [place] War and insomnia. GERD. Otherwise negative     Objective:   Physical Exam  Constitutional: He is oriented to person, place, and time. He appears well-developed and well-nourished.  HENT:  Head: Normocephalic.  Right Ear: External ear normal.  Left Ear: External ear normal.  Mouth/Throat: Oropharynx is clear and moist. No oropharyngeal exudate.  Eyes: Conjunctivae and EOM are normal. Pupils are equal, round, and reactive to light. Right eye exhibits no discharge. Left eye exhibits no discharge. No scleral icterus.  Neck: Neck supple. No JVD present. No thyromegaly present.  Cardiovascular: Normal rate, normal heart sounds and intact distal pulses.   No murmur heard. Pulmonary/Chest: Effort normal and breath sounds normal. He has no wheezes. He has no rales.  Abdominal: Soft. Bowel sounds are normal. He exhibits no distension and no mass. There is no tenderness. There is no rebound and no guarding.  Genitourinary: Rectum normal and prostate normal.    Musculoskeletal: Normal range of motion. He exhibits no edema.  Lymphadenopathy:    He has no cervical adenopathy.  Neurological: He is alert and oriented to person, place, and time. He has normal reflexes. He displays normal reflexes. No cranial nerve deficit. Coordination normal.  Skin: Skin is warm and dry.  Onychomycosis  Psychiatric: He has a normal mood and affect. His behavior is normal. Judgment and thought content normal.  Vitals reviewed.         Assessment & Plan:  GE reflux  Insomnia  Depression  Hypertension  Toenails fungus-improved with Lamisil  Hyperlipidemia  Plan: Flu vaccine given. Continue same medications and return in 6 months. The above medical problems are stable on medication.  Subjective:   Patient presents for Medicare Annual/Subsequent preventive examination.  Review Past Medical/Family/Social: See above   Risk Factors  Current exercise habits: Stays active about his home Dietary issues discussed: Low fat, low car  Cardiac risk factors: Hypertension, hyperlipidemia  Depression Screen  (Note: if answer to either of the following is "Yes", a more complete depression screening is indicated)   Over the past two weeks, have you felt down, depressed or hopeless? No  Over the past two weeks, have you felt little interest or pleasure in doing things? No Have you lost interest or pleasure in daily life? No Do you often feel hopeless? No Do you cry easily over simple problems? No   Activities of Daily Living  In your present state of health, do you have any difficulty performing the following activities?:   Driving? No  Managing money? No  Feeding yourself? No  Getting from bed to chair?  No  Climbing a flight of stairs? No  Preparing food and eating?: No  Bathing or showering? No  Getting dressed: No  Getting to the toilet? No  Using the toilet:No  Moving around from place to place: No  In the past year have you fallen or had a near  fall?:No  Are you sexually active? yes Do you have more than one partner? No   Hearing Difficulties: No  Do you often ask people to speak up or repeat themselves? No  Do you experience ringing or noises in your ears? No  Do you have difficulty understanding soft or whispered voices? No  Do you feel that you have a problem with memory? No Do you often misplace items? No    Home Safety:  Do you have a smoke alarm at your residence? Yes Do you have grab bars in the bathroom? No Do you have throw rugs in your house? No   Cognitive Testing  Alert? Yes Normal Appearance?Yes  Oriented to person? Yes Place? Yes  Time? Yes  Recall of three objects? Yes  Can perform simple calculations? Yes  Displays appropriate judgment?Yes  Can read the correct time from a watch face?Yes   List the Names of Other Physician/Practitioners you currently use:  See referral list for the physicians patient is currently seeing.  Dr. Fuller Plan for colonoscopy   Review of Systems: See above   Objective:     General appearance: Appears stated age Head: Normocephalic, without obvious abnormality, atraumatic  Eyes: conj clear, EOMi PEERLA  Ears: normal TM's and external ear canals both ears  Nose: Nares normal. Septum midline. Mucosa normal. No drainage or sinus tenderness.  Throat: lips, mucosa, and tongue normal; teeth and gums normal  Neck: no adenopathy, no carotid bruit, no JVD, supple, symmetrical, trachea midline and thyroid not enlarged, symmetric, no tenderness/mass/nodules  No CVA tenderness.  Lungs: clear to auscultation bilaterally  Breasts: normal appearance, no masses or tenderness Heart: regular rate and rhythm, S1, S2 normal, no murmur, click, rub or gallop  Abdomen: soft, non-tender; bowel sounds normal; no masses, no organomegaly  Musculoskeletal: ROM normal in all joints, no crepitus, no deformity, Normal muscle strengthen. Back  is symmetric, no curvature. Skin: Skin color, texture,  turgor normal. No rashes or lesions  Lymph nodes: Cervical, supraclavicular, and axillary nodes normal.  Neurologic: CN 2 -12 Normal, Normal symmetric reflexes. Normal coordination and gait  Psych: Alert & Oriented x 3, Mood appear stable.    Assessment:    Annual wellness medicare exam   Plan:    During the course of the visit the patient was educated and counseled about appropriate screening and preventive services including:   Annual PSA Pneumovax and flu vaccines given today. Order given to have shingles vaccine at pharmacy    Patient Instructions (the written plan) was given to the patient.  Medicare Attestation  I have personally reviewed:  The patient's medical and social history  Their use of alcohol, tobacco or illicit drugs  Their current medications and supplements  The patient's functional ability including ADLs,fall risks, home safety risks, cognitive, and hearing and visual impairment  Diet and physical activities  Evidence for depression or mood disorders  The patient's weight, height, BMI, and visual acuity have been recorded in the chart. I have made referrals, counseling, and provided education to the patient based on review of the above and I have provided the patient with a written personalized care plan for preventive services.

## 2015-02-06 ENCOUNTER — Other Ambulatory Visit: Payer: Self-pay | Admitting: Internal Medicine

## 2015-03-09 ENCOUNTER — Ambulatory Visit: Payer: Medicare Other | Admitting: Internal Medicine

## 2015-03-17 ENCOUNTER — Other Ambulatory Visit: Payer: Medicare PPO | Admitting: Internal Medicine

## 2015-03-17 DIAGNOSIS — Z79899 Other long term (current) drug therapy: Secondary | ICD-10-CM

## 2015-03-17 DIAGNOSIS — E785 Hyperlipidemia, unspecified: Secondary | ICD-10-CM

## 2015-03-17 LAB — LIPID PANEL
CHOL/HDL RATIO: 6.1 ratio
Cholesterol: 214 mg/dL — ABNORMAL HIGH (ref 0–200)
HDL: 35 mg/dL — ABNORMAL LOW (ref >=40)
LDL CALC: 158 mg/dL — AB (ref 0–99)
Triglycerides: 103 mg/dL (ref <150)
VLDL: 21 mg/dL (ref 0–40)

## 2015-03-17 LAB — HEPATIC FUNCTION PANEL
ALK PHOS: 108 U/L (ref 39–117)
ALT: 21 U/L (ref 0–53)
AST: 18 U/L (ref 0–37)
Albumin: 4 g/dL (ref 3.5–5.2)
BILIRUBIN DIRECT: 0.1 mg/dL (ref 0.0–0.3)
Indirect Bilirubin: 0.3 mg/dL (ref 0.2–1.2)
TOTAL PROTEIN: 7 g/dL (ref 6.0–8.3)
Total Bilirubin: 0.4 mg/dL (ref 0.2–1.2)

## 2015-03-20 ENCOUNTER — Other Ambulatory Visit: Payer: Self-pay | Admitting: Internal Medicine

## 2015-03-23 ENCOUNTER — Encounter: Payer: Self-pay | Admitting: Internal Medicine

## 2015-03-23 ENCOUNTER — Ambulatory Visit (INDEPENDENT_AMBULATORY_CARE_PROVIDER_SITE_OTHER): Payer: Medicare PPO | Admitting: Internal Medicine

## 2015-03-23 VITALS — BP 136/80 | HR 92 | Temp 97.8°F | Wt 209.0 lb

## 2015-03-23 DIAGNOSIS — E785 Hyperlipidemia, unspecified: Secondary | ICD-10-CM | POA: Diagnosis not present

## 2015-03-23 MED ORDER — ATORVASTATIN CALCIUM 40 MG PO TABS: 40.0000 mg | ORAL_TABLET | Freq: Every day | ORAL | Status: DC

## 2015-04-06 ENCOUNTER — Other Ambulatory Visit: Payer: Self-pay | Admitting: Internal Medicine

## 2015-05-05 ENCOUNTER — Other Ambulatory Visit: Payer: Self-pay | Admitting: Internal Medicine

## 2015-05-14 ENCOUNTER — Encounter: Payer: Self-pay | Admitting: Internal Medicine

## 2015-05-14 NOTE — Patient Instructions (Signed)
Watch diet and exercise. Continue same medications. Return in 6 months for physical examination.

## 2015-05-14 NOTE — Progress Notes (Signed)
   Subjective:    Patient ID: Juan Garcia, male    DOB: 1951-11-06, 64 y.o.   MRN: [id number]  HPI  64 year old male in today for six-month recheck on hyperlipidemia, hypertension, depression. He is on Wellbutrin for depression and is stable. 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 Of time time but was asked to return to complete this school year. Enjoying playing music. Probably not watching diet as closely as he should. He is on Lipitor 40 mg daily, amlodipine and losartan. Blood pressure is stable. However his LDL cholesterol has increased to 158 from a year ago when it was 131. I really dealt want to increase his lipid lowering medication at this point. I think he could do better with diet and exercise.  Blood pressure stable at 136/80. He is actually lost 8 pounds since last visit in October.    Review of Systems     Objective:   Physical Exam  Skin warm and dry. Nodes none. No JVD thyromegaly or carotid bruits. Chest clear to auscultation. Cardiac exam regular rate and rhythm normal S1 and S2. Extremity is without edema. Affect is appropriate      Assessment & Plan:  Essential hypertension  Hyperlipidemia-would like to see LDL cholesterol lower. He'll try harder with diet and exercise and recheck in 6 months. We may have to go up on statin therapy. Would like to see his LDL at goal.  History depression-stable on Wellbutrin  Plan: Return in 6 months for physical examination.

## 2015-06-27 ENCOUNTER — Other Ambulatory Visit: Payer: Medicare PPO | Admitting: Internal Medicine

## 2015-06-27 DIAGNOSIS — Z79899 Other long term (current) drug therapy: Secondary | ICD-10-CM

## 2015-06-27 DIAGNOSIS — E785 Hyperlipidemia, unspecified: Secondary | ICD-10-CM

## 2015-06-27 LAB — HEPATIC FUNCTION PANEL
ALK PHOS: 107 U/L (ref 39–117)
ALT: 24 U/L (ref 0–53)
AST: 18 U/L (ref 0–37)
Albumin: 4.2 g/dL (ref 3.5–5.2)
Bilirubin, Direct: 0.1 mg/dL (ref 0.0–0.3)
Indirect Bilirubin: 0.3 mg/dL (ref 0.2–1.2)
Total Bilirubin: 0.4 mg/dL (ref 0.2–1.2)
Total Protein: 6.7 g/dL (ref 6.0–8.3)

## 2015-06-27 LAB — LIPID PANEL
CHOLESTEROL: 178 mg/dL (ref 0–200)
HDL: 40 mg/dL (ref >=40)
LDL CALC: 122 mg/dL — AB (ref 0–99)
Total CHOL/HDL Ratio: 4.5 Ratio
Triglycerides: 79 mg/dL (ref <150)
VLDL: 16 mg/dL (ref 0–40)

## 2015-06-29 ENCOUNTER — Ambulatory Visit (INDEPENDENT_AMBULATORY_CARE_PROVIDER_SITE_OTHER): Payer: Medicare PPO | Admitting: Internal Medicine

## 2015-06-29 ENCOUNTER — Encounter: Payer: Self-pay | Admitting: Internal Medicine

## 2015-06-29 VITALS — BP 140/80 | HR 85 | Temp 97.8°F | Wt 208.0 lb

## 2015-06-29 DIAGNOSIS — I10 Essential (primary) hypertension: Secondary | ICD-10-CM

## 2015-06-29 DIAGNOSIS — E785 Hyperlipidemia, unspecified: Secondary | ICD-10-CM | POA: Diagnosis not present

## 2015-06-29 DIAGNOSIS — M79676 Pain in unspecified toe(s): Secondary | ICD-10-CM | POA: Diagnosis not present

## 2015-06-29 DIAGNOSIS — G4700 Insomnia, unspecified: Secondary | ICD-10-CM | POA: Diagnosis not present

## 2015-06-29 NOTE — Patient Instructions (Signed)
Continue same medications. No changes made today. Work on diet and exercise. Take Valium 10 mg at bedtime sparingly for sleep. Return in October for physical exam.

## 2015-06-29 NOTE — Progress Notes (Signed)
   Subjective:    Patient ID: Juan Garcia, male    DOB: [birth year], 63 y.o.   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 Visiting friends in the [place]. Having issues with insomnia which is long-standing. Have given him small amount of Valium to try on a when necessary basis but not every night. He feels well. No new complaints except sometimes his great toe which was partially amputated feels painful. Currently not painful. Offered x-ray it but he declined. He will let me know if it causes more issues.    Review of Systems     Objective:   Physical Exam Skin warm and dry. Nodes none. Neck is supple without JVD thyromegaly or carotid bruits. Chest clear to auscultation. Cardiac exam regular rate and rhythm. Pulses regular. Extremity without edema. Partial amputation great toe with no evidence of erythema or significant tenderness today       Assessment & Plan:   Hypertension-stable  Hyperlipidemia-reviewed lipid panel liver functions. Continue diet and exercise efforts  Insomnia-take Valium 10 mg sparingly as needed  Pain in great toe which was partially amputated due to osteomyelitis-call if symptoms persist  Plan: Return October 2016 for physical examination  25 minutes spent discussing these issues today

## 2015-09-26 ENCOUNTER — Other Ambulatory Visit: Payer: Self-pay | Admitting: Internal Medicine

## 2015-09-26 ENCOUNTER — Other Ambulatory Visit: Payer: Medicare PPO | Admitting: Internal Medicine

## 2015-09-26 DIAGNOSIS — Z79899 Other long term (current) drug therapy: Secondary | ICD-10-CM | POA: Diagnosis not present

## 2015-09-26 DIAGNOSIS — E785 Hyperlipidemia, unspecified: Secondary | ICD-10-CM

## 2015-09-26 DIAGNOSIS — Z125 Encounter for screening for malignant neoplasm of prostate: Secondary | ICD-10-CM | POA: Diagnosis not present

## 2015-09-26 DIAGNOSIS — R739 Hyperglycemia, unspecified: Secondary | ICD-10-CM | POA: Diagnosis not present

## 2015-09-26 LAB — LIPID PANEL
Cholesterol: 196 mg/dL (ref 125–200)
HDL: 35 mg/dL — AB (ref >=40)
LDL CALC: 141 mg/dL — AB (ref <130)
TRIGLYCERIDES: 100 mg/dL (ref <150)
Total CHOL/HDL Ratio: 5.6 Ratio — ABNORMAL HIGH (ref <=5.0)
VLDL: 20 mg/dL (ref <30)

## 2015-09-26 LAB — COMPLETE METABOLIC PANEL WITH GFR
ALT: 24 U/L (ref 9–46)
AST: 19 U/L (ref 10–35)
Albumin: 4.3 g/dL (ref 3.6–5.1)
Alkaline Phosphatase: 125 U/L — ABNORMAL HIGH (ref 40–115)
BUN: 21 mg/dL (ref 7–25)
CO2: 27 mmol/L (ref 20–31)
Calcium: 9.3 mg/dL (ref 8.6–10.3)
Chloride: 101 mmol/L (ref 98–110)
Creat: 0.95 mg/dL (ref 0.70–1.25)
GFR, EST NON AFRICAN AMERICAN: 83 mL/min (ref >=60)
GFR, Est African American: >89 mL/min (ref >=60)
GLUCOSE: 117 mg/dL — AB (ref 65–99)
POTASSIUM: 4.8 mmol/L (ref 3.5–5.3)
SODIUM: 138 mmol/L (ref 135–146)
TOTAL PROTEIN: 6.9 g/dL (ref 6.1–8.1)
Total Bilirubin: 0.4 mg/dL (ref 0.2–1.2)

## 2015-09-26 LAB — CBC WITH DIFFERENTIAL/PLATELET
BASOS PCT: 0 % (ref 0–1)
Basophils Absolute: 0 10*3/uL (ref 0.0–0.1)
EOS ABS: 0.5 10*3/uL (ref 0.0–0.7)
Eosinophils Relative: 5 % (ref 0–5)
HCT: 46.7 % (ref 39.0–52.0)
HEMOGLOBIN: 16.3 g/dL (ref 13.0–17.0)
Lymphocytes Relative: 33 % (ref 12–46)
Lymphs Abs: 3 10*3/uL (ref 0.7–4.0)
MCH: 29.7 pg (ref 26.0–34.0)
MCHC: 34.9 g/dL (ref 30.0–36.0)
MCV: 85.1 fL (ref 78.0–100.0)
MONOS PCT: 10 % (ref 3–12)
MPV: 9.9 fL (ref 8.6–12.4)
Monocytes Absolute: 0.9 10*3/uL (ref 0.1–1.0)
NEUTROS ABS: 4.7 10*3/uL (ref 1.7–7.7)
NEUTROS PCT: 52 % (ref 43–77)
PLATELETS: 252 10*3/uL (ref 150–400)
RBC: 5.49 MIL/uL (ref 4.22–5.81)
RDW: 14.8 % (ref 11.5–15.5)
WBC: 9.1 10*3/uL (ref 4.0–10.5)

## 2015-09-27 LAB — PSA, MEDICARE: PSA: 2.82 ng/mL (ref <=4.00)

## 2015-09-28 ENCOUNTER — Ambulatory Visit (INDEPENDENT_AMBULATORY_CARE_PROVIDER_SITE_OTHER): Payer: Medicare PPO | Admitting: Internal Medicine

## 2015-09-28 ENCOUNTER — Encounter: Payer: Self-pay | Admitting: Internal Medicine

## 2015-09-28 VITALS — BP 140/90 | HR 100 | Temp 98.9°F | Resp 14 | Ht 73.0 in | Wt 209.0 lb

## 2015-09-28 DIAGNOSIS — W57XXXA Bitten or stung by nonvenomous insect and other nonvenomous arthropods, initial encounter: Secondary | ICD-10-CM

## 2015-09-28 DIAGNOSIS — F32A Depression, unspecified: Secondary | ICD-10-CM

## 2015-09-28 DIAGNOSIS — R748 Abnormal levels of other serum enzymes: Secondary | ICD-10-CM | POA: Diagnosis not present

## 2015-09-28 DIAGNOSIS — I499 Cardiac arrhythmia, unspecified: Secondary | ICD-10-CM | POA: Diagnosis not present

## 2015-09-28 DIAGNOSIS — I10 Essential (primary) hypertension: Secondary | ICD-10-CM | POA: Diagnosis not present

## 2015-09-28 DIAGNOSIS — Z0000 Encounter for general adult medical examination without abnormal findings: Secondary | ICD-10-CM

## 2015-09-28 DIAGNOSIS — F329 Major depressive disorder, single episode, unspecified: Secondary | ICD-10-CM | POA: Diagnosis not present

## 2015-09-28 DIAGNOSIS — Z23 Encounter for immunization: Secondary | ICD-10-CM | POA: Diagnosis not present

## 2015-09-28 DIAGNOSIS — R739 Hyperglycemia, unspecified: Secondary | ICD-10-CM | POA: Diagnosis not present

## 2015-09-28 DIAGNOSIS — E785 Hyperlipidemia, unspecified: Secondary | ICD-10-CM

## 2015-09-28 LAB — POCT URINALYSIS DIPSTICK
BILIRUBIN UA: NEGATIVE
GLUCOSE UA: NEGATIVE
Leukocytes, UA: NEGATIVE
Nitrite, UA: NEGATIVE
RBC UA: NEGATIVE
SPEC GRAV UA: 1.02
UROBILINOGEN UA: NEGATIVE
pH, UA: 6

## 2015-09-28 NOTE — Progress Notes (Signed)
Subjective:    Patient ID: Juan Garcia, male    DOB: 1951/05/05, 64 y.o.   MRN: [id number]  HPI 64 year old White Male in today for health maintenance exam and evaluation of medical issues. History of hyperlipidemia and depression. History of hypertension treated with amlodipine and Cozaar. BP is elevated today. Has lost 6 pounds since Oct 2015. Says he had an argument with his wife yesterday and that is why his blood pressure is a bit elevated. Says he only has about 1 alcoholic drink a week. We noticed he has an alkaline phosphatase of 125 which is elevated from last year.  Intolerant of codeine  Colonoscopy 2012 by Dr. Fuller Plan. 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 Had cellulitis from apparent insect bite that progressed to osteomyelitis.  History of anxiety and insomnia related to posttraumaticstress syndrome related to service in [place] War.        Review of Systems  Constitutional: Negative.   HENT: Negative.   Eyes: Negative.   Respiratory: Negative.   Cardiovascular: Negative.   Genitourinary: Positive for frequency.  Neurological: Negative.   Hematological: Negative.        Objective:   Physical Exam  Constitutional: He is oriented to person, place, and time. He appears well-developed and well-nourished. No distress.  HENT:  Head: Normocephalic and atraumatic.  Mouth/Throat: Oropharynx is clear and moist.  TMs chronically scarred  Eyes: Conjunctivae and EOM are normal. Pupils are equal, round, and reactive to light. Right eye exhibits no discharge. Left eye exhibits no discharge.  Neck: Neck supple. No JVD present. No thyromegaly present.  Cardiovascular: Normal heart sounds.   No murmur heard. Irregular rhythm. EKG shows frequent PVCs and PACs  Abdominal: Soft. Bowel sounds are normal.  Genitourinary:  Prostate enlarged without nodules  Musculoskeletal: He exhibits no edema.  Neurological: He is  alert and oriented to person, place, and time. He has normal reflexes.  Skin: He is not diaphoretic.  Multiple insect bites on legs  Psychiatric: He has a normal mood and affect. His behavior is normal. Judgment and thought content normal.  Vitals reviewed.         Assessment & Plan:  Elevated blood pressure-likely due to argument with wife  Elevated alkaline phosphatase  Hyperlipidemia-elevated LDL increased from 122-141. Watch diet.  Elevated serum glucose-check hemoglobin A1c  Frequent PVCs-asymptomatic  Essential hypertension-blood pressure elevated today due to anxiety  History of anxiety and depression-treated with 12 uterine at present time. He also has Valium. He doesn't want any more medication at the present time  GE reflux  Insomnia  Depression    Plan: Patient should return here in one month for follow-up with blood pressure check, repeat alkaline phosphatase. Work on diet and exercise. Recheck blood pressure in 1 month. No change in medications at the present time. Hemoglobin A1c added to previous labs.  Subjective:   Patient presents for Medicare Annual/Subsequent preventive examination.  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 No  Over the past two weeks, have you felt little interest or pleasure in doing things? No Have you  lost interest or pleasure in daily life? No Do you often feel hopeless? No Do you cry easily over simple problems? No   Activities of Daily Living  In your present state of health, do you have any difficulty performing the following activities?:   Driving? No    Managing money? No  Feeding yourself? No  Getting from bed to chair? No  Climbing a flight of stairs? No  Preparing food and eating?: No  Bathing or showering? No  Getting dressed: No  Getting to the toilet? No  Using the toilet:No  Moving around from place to place: No  In the past year have you fallen or had a near fall?:No  Are you sexually active? yes Do you have more than one partner? No   Hearing Difficulties: No  Do you often ask people to speak up or repeat themselves? No  Do you experience ringing or noises in your ears? No  Do you have difficulty understanding soft or whispered voices? No  Do you feel that you have a problem with memory? No Do you often misplace items? No    Home Safety:  Do you have a smoke alarm at your residence? Yes Do you have grab bars in the bathroom? no Do you have throw rugs in your house?yes   Cognitive Testing  Alert? Yes Normal Appearance?Yes  Oriented to person? Yes Place? Yes  Time? Yes  Recall of three objects? Yes  Can perform simple calculations? Yes  Displays appropriate judgment?Yes  Can read the correct time from a watch face?Yes   List the Names of Other Physician/Practitioners you currently use:  See referral list for the physicians patient is currently seeing.     Review of Systems: See above  Objective:     General appearance: Appears stated age  Head: Normocephalic, without obvious abnormality, atraumatic  Eyes: conj clear, EOMi PEERLA  Ears: normal TM's and external ear canals both ears  Nose: Nares normal. Septum midline. Mucosa normal. No drainage or sinus tenderness.  Throat: lips, mucosa, and tongue normal; teeth and gums normal  Neck: no adenopathy, no carotid bruit, no JVD, supple, symmetrical, trachea midline and thyroid not enlarged, symmetric, no tenderness/mass/nodules  No CVA tenderness.  Lungs: clear to auscultation bilaterally  Breasts: normal appearance, no masses or tenderness Heart:  regular rate and rhythm, S1, S2 normal, no murmur, click, rub or gallop  Abdomen: soft, non-tender; bowel sounds normal; no masses, no organomegaly  Musculoskeletal: ROM normal in all joints, no crepitus, no deformity, Normal muscle strengthen. Back  is symmetric, no curvature. Skin: Skin color, texture, turgor normal.  Lymph nodes: Cervical, supraclavicular, and axillary nodes normal.  Neurologic: CN 2 -12 Normal, Normal symmetric reflexes. Normal coordination and gait  Psych: Alert & Oriented x 3, Mood appear stable.    Assessment:    Annual wellness medicare exam   Plan:    During the course of the visit the patient was educated and counseled about appropriate screening and preventive services including:  Annual PSA  Needs pneumococcal 23 in the near future    Patient Instructions (the written plan) was given to the patient.  Medicare Attestation  I have personally reviewed:  The patient's medical and social history  Their use of alcohol, tobacco or illicit drugs  Their current medications and supplements  The patient's functional ability including ADLs,fall risks, home safety risks, cognitive, and hearing and visual impairment  Diet and physical activities  Evidence for depression or mood disorders  The  patient's weight, height, BMI, and visual acuity have been recorded in the chart. I have made referrals, counseling, and provided education to the patient based on review of the above and I have provided the patient with a written personalized care plan for preventive services.

## 2015-09-28 NOTE — Patient Instructions (Addendum)
Hemoglobin A1c to be checked due to elevated serum glucose. Alkaline phosphatase is mildly elevated at 125. Watch diet and try to exercise.Return for follow-up on blood pressure.

## 2015-09-29 ENCOUNTER — Telehealth: Payer: Self-pay | Admitting: *Deleted

## 2015-09-29 LAB — HEMOGLOBIN A1C
Hgb A1c MFr Bld: 6.5 % — ABNORMAL HIGH (ref <5.7)
Mean Plasma Glucose: 140 mg/dL — ABNORMAL HIGH (ref <117)

## 2015-09-29 NOTE — Telephone Encounter (Signed)
Sharyn Lull please call patient and referral to Diabetes clinic at [facility]

## 2015-10-02 NOTE — Telephone Encounter (Signed)
Faxed referral/documentation to [facility] @ [phone] to schedule patient.

## 2015-10-11 ENCOUNTER — Other Ambulatory Visit: Payer: Self-pay

## 2015-10-11 MED ORDER — AMLODIPINE BESYLATE 5 MG PO TABS: 5.0000 mg | ORAL_TABLET | Freq: Every day | ORAL | Status: DC

## 2015-10-11 MED ORDER — ATORVASTATIN CALCIUM 40 MG PO TABS: 40.0000 mg | ORAL_TABLET | Freq: Every day | ORAL | Status: DC

## 2015-10-11 MED ORDER — BUPROPION HCL ER (XL) 300 MG PO TB24: 300.0000 mg | ORAL_TABLET | Freq: Every day | ORAL | Status: DC

## 2015-10-11 MED ORDER — LOSARTAN POTASSIUM 50 MG PO TABS: 50.0000 mg | ORAL_TABLET | Freq: Every day | ORAL | Status: DC

## 2015-10-15 ENCOUNTER — Telehealth: Payer: Self-pay | Admitting: Internal Medicine

## 2015-10-15 NOTE — Telephone Encounter (Signed)
Mailed order to patient for him to have pneumococcal 23 vaccine at local pharmacy as we do not carry that at the present time. He has had Prevnar last year. He is to call us with date of this vaccine when he receives it.

## 2015-10-16 NOTE — Telephone Encounter (Signed)
Patient notified

## 2015-10-18 DIAGNOSIS — H25013 Cortical age-related cataract, bilateral: Secondary | ICD-10-CM | POA: Diagnosis not present

## 2015-10-18 DIAGNOSIS — H40013 Open angle with borderline findings, low risk, bilateral: Secondary | ICD-10-CM | POA: Diagnosis not present

## 2015-10-18 DIAGNOSIS — H2513 Age-related nuclear cataract, bilateral: Secondary | ICD-10-CM | POA: Diagnosis not present

## 2015-10-18 DIAGNOSIS — H43393 Other vitreous opacities, bilateral: Secondary | ICD-10-CM | POA: Diagnosis not present

## 2015-10-30 ENCOUNTER — Encounter: Payer: Self-pay | Admitting: Internal Medicine

## 2015-10-30 ENCOUNTER — Ambulatory Visit (INDEPENDENT_AMBULATORY_CARE_PROVIDER_SITE_OTHER): Payer: Medicare PPO | Admitting: Internal Medicine

## 2015-10-30 VITALS — BP 142/90 | HR 107 | Temp 97.0°F | Resp 20 | Ht 73.0 in | Wt 207.0 lb

## 2015-10-30 DIAGNOSIS — J069 Acute upper respiratory infection, unspecified: Secondary | ICD-10-CM | POA: Diagnosis not present

## 2015-10-30 DIAGNOSIS — R030 Elevated blood-pressure reading, without diagnosis of hypertension: Secondary | ICD-10-CM | POA: Diagnosis not present

## 2015-10-30 DIAGNOSIS — N529 Male erectile dysfunction, unspecified: Secondary | ICD-10-CM

## 2015-10-30 DIAGNOSIS — H409 Unspecified glaucoma: Secondary | ICD-10-CM | POA: Diagnosis not present

## 2015-10-30 DIAGNOSIS — IMO0001 Reserved for inherently not codable concepts without codable children: Secondary | ICD-10-CM

## 2015-10-30 DIAGNOSIS — I10 Essential (primary) hypertension: Secondary | ICD-10-CM

## 2015-10-30 DIAGNOSIS — R748 Abnormal levels of other serum enzymes: Secondary | ICD-10-CM | POA: Diagnosis not present

## 2015-10-30 LAB — ALKALINE PHOSPHATASE: ALK PHOS: 124 U/L — AB (ref 40–115)

## 2015-10-30 MED ORDER — AZITHROMYCIN 250 MG PO TABS: ORAL_TABLET | ORAL | Status: DC

## 2015-10-30 NOTE — Patient Instructions (Addendum)
Return in 4 weeks for BP check. Take Z-pak for sore throat. Have ultrasound of gallbladder. Testosterone level pending

## 2015-10-31 LAB — TESTOSTERONE: Testosterone: 424 ng/dL (ref 300–890)

## 2015-10-31 NOTE — Progress Notes (Signed)
US order entered.

## 2015-11-06 ENCOUNTER — Ambulatory Visit
Admission: RE | Admit: 2015-11-06 | Discharge: 2015-11-06 | Disposition: A | Discharge status: Home / Self Care | Payer: Self-pay | Source: Ambulatory Visit | Attending: Internal Medicine | Admitting: Internal Medicine

## 2015-11-06 DIAGNOSIS — R748 Abnormal levels of other serum enzymes: Secondary | ICD-10-CM

## 2015-11-07 ENCOUNTER — Telehealth: Payer: Self-pay

## 2015-11-07 DIAGNOSIS — R9389 Abnormal findings on diagnostic imaging of other specified body structures: Secondary | ICD-10-CM

## 2015-11-07 DIAGNOSIS — R932 Abnormal findings on diagnostic imaging of liver and biliary tract: Secondary | ICD-10-CM

## 2015-11-13 NOTE — Telephone Encounter (Signed)
CT scheduled for 11/30 and patient made aware

## 2015-11-15 ENCOUNTER — Ambulatory Visit
Admission: RE | Admit: 2015-11-15 | Discharge: 2015-11-15 | Disposition: A | Discharge status: Home / Self Care | Payer: Medicare PPO | Source: Ambulatory Visit | Attending: Internal Medicine | Admitting: Internal Medicine

## 2015-11-15 ENCOUNTER — Encounter: Payer: Self-pay | Admitting: Internal Medicine

## 2015-11-15 DIAGNOSIS — N281 Cyst of kidney, acquired: Secondary | ICD-10-CM | POA: Diagnosis not present

## 2015-11-15 DIAGNOSIS — R9389 Abnormal findings on diagnostic imaging of other specified body structures: Secondary | ICD-10-CM

## 2015-11-15 DIAGNOSIS — R932 Abnormal findings on diagnostic imaging of liver and biliary tract: Secondary | ICD-10-CM

## 2015-11-15 MED ORDER — IOPAMIDOL (ISOVUE-300) INJECTION 61%
125.0000 mL | Freq: Once | INTRAVENOUS | Status: AC | PRN
  Administered 2015-11-15: 125 mL via INTRAVENOUS

## 2015-11-15 NOTE — Progress Notes (Signed)
   Subjective:    Patient ID: Juan Garcia, male    DOB: [birth year], 64 y.o.   MRN: [id number]  HPI at last visit he had had an argument with his wife and was upset. His blood pressure was elevated in the office. Ask him to come back for follow-up today. He also had an abnormal alkaline phosphatase which was elevated for some unknown reason. He has no history of right upper quadrant pain or known gallstones.  New complaint today is sore throat.  Alkaline phosphatase is 124 and previously was 125 with normal being 1:15. It's a very mild elevation but I think we would like to go ahead and pursue the workup.   He also wants his testosterone level checked    Review of Systems     Objective:   Physical Exam   Abdomen is benign. Blood pressure elevated at 142/90. Pharynx is red without exudate. TMs clear. Neck supple. Chest clear.    Assessment & Plan:  Acute URI  Elevated blood pressure  Elevated alkaline phosphatase  Plan: He is given a have ultrasound of his right upper quadrant to look at liver and bile duct. Take Zithromax Z-PAK for respiratory infection. Return in 4 weeks for blood pressure check. Check testosterone level at his request  25 minutes spent with patient

## 2015-11-28 ENCOUNTER — Encounter: Payer: Self-pay | Admitting: Internal Medicine

## 2015-11-28 ENCOUNTER — Ambulatory Visit (INDEPENDENT_AMBULATORY_CARE_PROVIDER_SITE_OTHER): Payer: Medicare PPO | Admitting: Internal Medicine

## 2015-11-28 VITALS — BP 130/80 | HR 90 | Temp 98.4°F | Resp 20 | Ht 73.0 in | Wt 211.0 lb

## 2015-11-28 DIAGNOSIS — R748 Abnormal levels of other serum enzymes: Secondary | ICD-10-CM | POA: Diagnosis not present

## 2015-11-28 DIAGNOSIS — F411 Generalized anxiety disorder: Secondary | ICD-10-CM | POA: Diagnosis not present

## 2015-11-28 DIAGNOSIS — I10 Essential (primary) hypertension: Secondary | ICD-10-CM | POA: Diagnosis not present

## 2015-11-28 LAB — BASIC METABOLIC PANEL
BUN: 18 mg/dL (ref 7–25)
CHLORIDE: 106 mmol/L (ref 98–110)
CO2: 26 mmol/L (ref 20–31)
Calcium: 8.8 mg/dL (ref 8.6–10.3)
Creat: 0.89 mg/dL (ref 0.70–1.25)
Glucose, Bld: 101 mg/dL — ABNORMAL HIGH (ref 65–99)
POTASSIUM: 4 mmol/L (ref 3.5–5.3)
SODIUM: 141 mmol/L (ref 135–146)

## 2015-11-29 ENCOUNTER — Encounter: Payer: Self-pay | Admitting: Internal Medicine

## 2015-11-29 NOTE — Patient Instructions (Signed)
Continue losartan 50 mg daily. Return in May.

## 2015-11-29 NOTE — Progress Notes (Signed)
   Subjective:    Patient ID: Juan Garcia, male    DOB: 1951/01/29, 64 y.o.   MRN: [id number]  HPI Is here today to follow-up on his blood pressure having started losartan 50 mg daily. Has some anxiety over recent abnormal lab test which was mildly elevated alkaline phosphatase. He had an ultrasound which raise possibility of gallbladder polyps. Subsequently had CT which was unremarkable. We are going to not work up for mildly elevated alkaline phosphatase any further and he was reassured today. He also has found out recently he may need to move from his residence of many years. Juan Garcia potentially may be sold and he is been renting for a number of years. He is finding that stressful because it's uncertain as to when that might occur or if it will occur. He doesn't like Christmas because his father died on Christmas Eve.    Review of Systems     Objective:   Physical Exam  Chest clear to auscultation. Cardiac exam regular rate and rhythm. Extremities without edema. Blood pressure rechecked 130/80 left arm      Assessment & Plan:  Essential hypertension-stable on losartan 50 mg daily. Basic metabolic panel on today. Follow-up in 6 months.  Elevated alkaline phosphatase-no cause for concern with negative CT of the abdomen. Continue to monitor.  Anxiety-has Valium on hand for episodes of anxiety  Plan: Follow-up appointment 04/26/2016 which will be a six-month recheck

## 2015-12-08 ENCOUNTER — Telehealth: Payer: Self-pay | Admitting: Internal Medicine

## 2015-12-08 ENCOUNTER — Other Ambulatory Visit: Payer: Self-pay

## 2015-12-08 MED ORDER — DIAZEPAM 10 MG PO TABS: 10.0000 mg | ORAL_TABLET | Freq: Every evening | ORAL | Status: DC | PRN

## 2015-12-08 NOTE — Telephone Encounter (Signed)
States that his pharmacy called in a refill for his Valium.  Don't see that in our in basket.  Wants to make sure that his refill gets called in.    Pharmacy:  [place]

## 2015-12-08 NOTE — Telephone Encounter (Signed)
Refill once 

## 2016-03-01 ENCOUNTER — Other Ambulatory Visit: Payer: Self-pay

## 2016-03-01 MED ORDER — DIAZEPAM 10 MG PO TABS: 10.0000 mg | ORAL_TABLET | Freq: Every evening | ORAL | Status: AC | PRN

## 2016-03-01 NOTE — Telephone Encounter (Signed)
Diazepam refill called into [facility].

## 2016-04-03 ENCOUNTER — Other Ambulatory Visit: Payer: Self-pay

## 2016-04-03 MED ORDER — ATORVASTATIN CALCIUM 40 MG PO TABS: 40.0000 mg | ORAL_TABLET | Freq: Every day | ORAL | Status: AC

## 2016-04-18 ENCOUNTER — Other Ambulatory Visit: Payer: Self-pay

## 2016-04-18 MED ORDER — BUPROPION HCL ER (XL) 300 MG PO TB24: 300.0000 mg | ORAL_TABLET | Freq: Every day | ORAL | Status: AC

## 2016-04-18 NOTE — Telephone Encounter (Signed)
Pt needed refill 

## 2016-04-23 ENCOUNTER — Other Ambulatory Visit: Payer: Medicare PPO | Admitting: Internal Medicine

## 2016-04-26 ENCOUNTER — Ambulatory Visit: Payer: Medicare PPO | Admitting: Internal Medicine

## 2016-05-20 ENCOUNTER — Other Ambulatory Visit: Payer: Self-pay

## 2016-05-20 MED ORDER — LOSARTAN POTASSIUM 50 MG PO TABS: 50.0000 mg | ORAL_TABLET | Freq: Every day | ORAL | Status: AC

## 2016-05-22 ENCOUNTER — Other Ambulatory Visit: Payer: Self-pay

## 2016-05-22 MED ORDER — AMLODIPINE BESYLATE 5 MG PO TABS: 5.0000 mg | ORAL_TABLET | Freq: Every day | ORAL | Status: AC

## 2016-07-18 ENCOUNTER — Telehealth: Payer: Self-pay | Admitting: Internal Medicine

## 2016-07-18 ENCOUNTER — Other Ambulatory Visit: Payer: Self-pay | Admitting: Internal Medicine

## 2016-07-18 ENCOUNTER — Other Ambulatory Visit: Payer: Medicare PPO | Admitting: Internal Medicine

## 2016-07-18 DIAGNOSIS — I10 Essential (primary) hypertension: Secondary | ICD-10-CM

## 2016-07-18 DIAGNOSIS — Z0000 Encounter for general adult medical examination without abnormal findings: Secondary | ICD-10-CM

## 2016-07-18 DIAGNOSIS — E785 Hyperlipidemia, unspecified: Secondary | ICD-10-CM

## 2016-07-18 LAB — CBC WITH DIFFERENTIAL/PLATELET

## 2016-07-18 LAB — LIPID PANEL

## 2016-07-18 LAB — COMPLETE METABOLIC PANEL WITH GFR

## 2016-07-18 LAB — PSA

## 2016-07-18 NOTE — Telephone Encounter (Signed)
Patient came in this morning for CPE Labs and is scheduled for CPE tomorrow.  However, patient had 2016 CPE on 09/28/15.  Patient should not have been booked until after 09/27/16.  Called patient to advise that his appointment was booked too early and that we needed to cancel his appointment and reschedule.  Patient advised that they are moving to [place], [place] the end of September.  He's not sure that he wants to reschedule at this time due to their moving.  He'll let me know.    He had labs this morning.  Advised I would call [facility] and see if I could cancel those labs.  Spoke with Coralyn Mark at [facility] [phone], option 2.  She advised that they do not show any specimens at this time; so she would cancel the order.  She did say that they may show up as TNP (test not performed).  Told her that I would note the patient's chart and that we didn't want the patient to have to be penalized for our mistake.    Spoke with patient and made him aware that things have been cancelled and patient is content and verbalized understanding of this information.  He'll call us back if he decides that he wants to book a CPE for October IF they can and have time with their moving.

## 2016-07-19 ENCOUNTER — Encounter: Payer: Self-pay | Admitting: Internal Medicine

## 2016-07-19 LAB — CBC WITH DIFFERENTIAL/PLATELET
BASOS ABS: 0 {cells}/uL (ref 0–200)
BASOS PCT: 0 %
EOS ABS: 264 {cells}/uL (ref 15–500)
Eosinophils Relative: 3 %
HEMATOCRIT: 46.3 % (ref 38.5–50.0)
Hemoglobin: 15.3 g/dL (ref 13.2–17.1)
LYMPHS PCT: 32 %
Lymphs Abs: 2816 cells/uL (ref 850–3900)
MCH: 29 pg (ref 27.0–33.0)
MCHC: 33 g/dL (ref 32.0–36.0)
MCV: 87.9 fL (ref 80.0–100.0)
MONO ABS: 704 {cells}/uL (ref 200–950)
MPV: 9.8 fL (ref 7.5–12.5)
Monocytes Relative: 8 %
NEUTROS ABS: 5016 {cells}/uL (ref 1500–7800)
Neutrophils Relative %: 57 %
Platelets: 239 10*3/uL (ref 140–400)
RBC: 5.27 MIL/uL (ref 4.20–5.80)
RDW: 14.5 % (ref 11.0–15.0)
WBC: 8.8 10*3/uL (ref 3.8–10.8)

## 2016-07-19 LAB — LIPID PANEL
CHOL/HDL RATIO: 4.4 ratio (ref <=5.0)
Cholesterol: 177 mg/dL (ref 125–200)
HDL: 40 mg/dL (ref >=40)
LDL CALC: 121 mg/dL (ref <130)
Triglycerides: 82 mg/dL (ref <150)
VLDL: 16 mg/dL (ref <30)

## 2016-07-19 LAB — COMPLETE METABOLIC PANEL WITH GFR
ALT: 17 U/L (ref 9–46)
AST: 15 U/L (ref 10–35)
Albumin: 3.9 g/dL (ref 3.6–5.1)
Alkaline Phosphatase: 108 U/L (ref 40–115)
BUN: 18 mg/dL (ref 7–25)
CHLORIDE: 106 mmol/L (ref 98–110)
CO2: 27 mmol/L (ref 20–31)
CREATININE: 0.91 mg/dL (ref 0.70–1.25)
Calcium: 8.9 mg/dL (ref 8.6–10.3)
GFR, Est Non African American: 87 mL/min (ref >=60)
GLUCOSE: 102 mg/dL — AB (ref 65–99)
Potassium: 4.6 mmol/L (ref 3.5–5.3)
SODIUM: 139 mmol/L (ref 135–146)
Total Bilirubin: 0.4 mg/dL (ref 0.2–1.2)
Total Protein: 6.7 g/dL (ref 6.1–8.1)

## 2016-07-19 LAB — PSA: PSA: 2.99 ng/mL (ref <=4.00)

## 2016-10-09 ENCOUNTER — Telehealth: Payer: Self-pay | Admitting: Internal Medicine

## 2016-10-09 ENCOUNTER — Encounter: Payer: Self-pay | Admitting: Internal Medicine

## 2016-10-09 DIAGNOSIS — F419 Anxiety disorder, unspecified: Secondary | ICD-10-CM

## 2016-10-09 DIAGNOSIS — I10 Essential (primary) hypertension: Secondary | ICD-10-CM

## 2016-10-09 NOTE — Telephone Encounter (Signed)
Patient has moved to [facility] needs refills on days of pain, losartan, amlodipine. Had explained to them previously we will willing to give 90 days refills but no more. They need to locate another primary care physician in [place]. Called refills to [facility] area code [phone]

## 2017-07-27 IMAGING — US US ABDOMEN COMPLETE
1 series · 13 of 25 positions shown · non-contrast
Comparison: None.

CLINICAL DATA: Elevated alkaline phosphatase

EXAM:
ULTRASOUND ABDOMEN COMPLETE

[Series 1: us abdomen complete · 0.35mm/px · 13 of 93 slices shown]
[im 1/93]
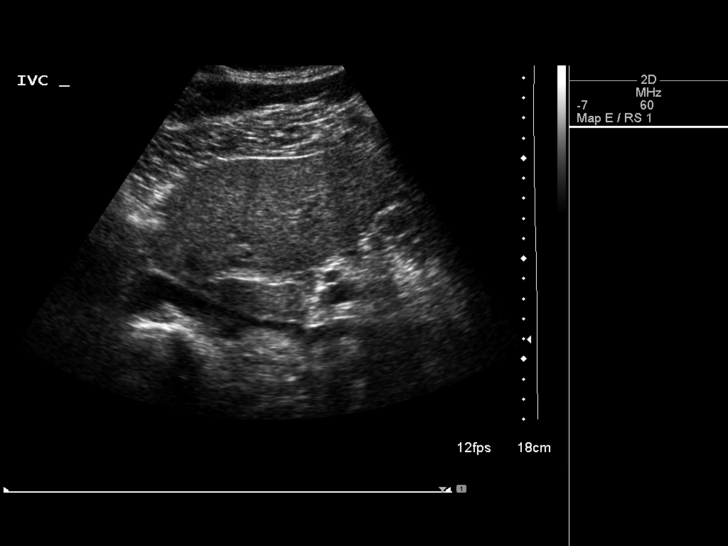
[im 8/93]
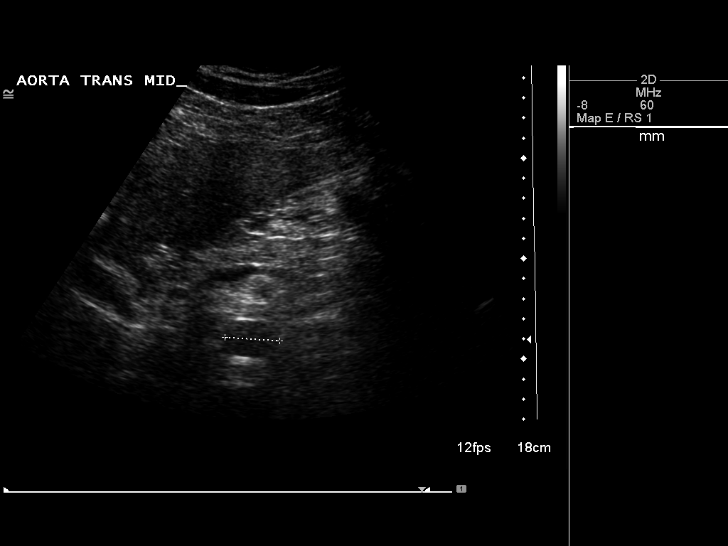
[im 16/93]
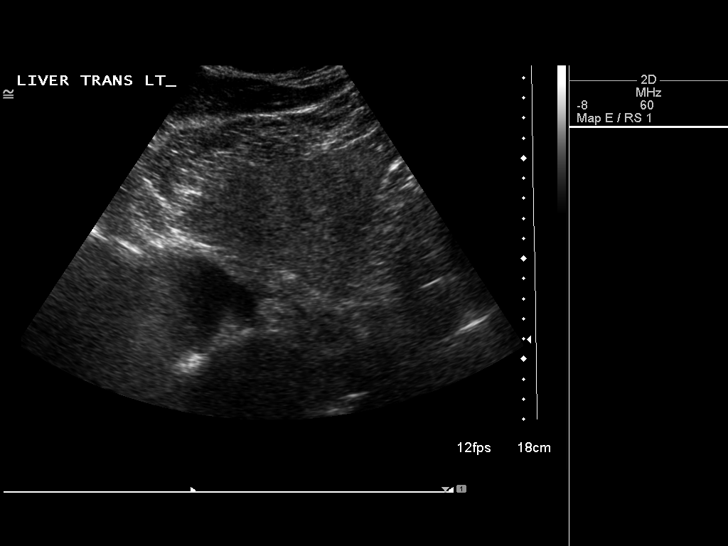
[im 24/93]
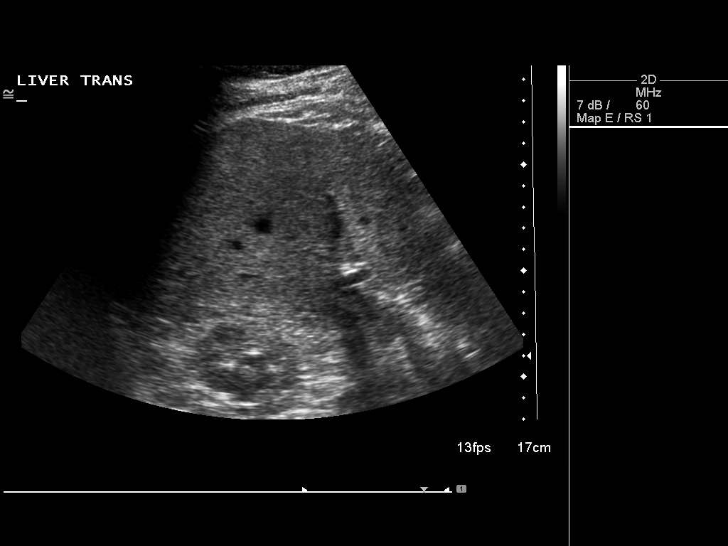
[im 31/93]
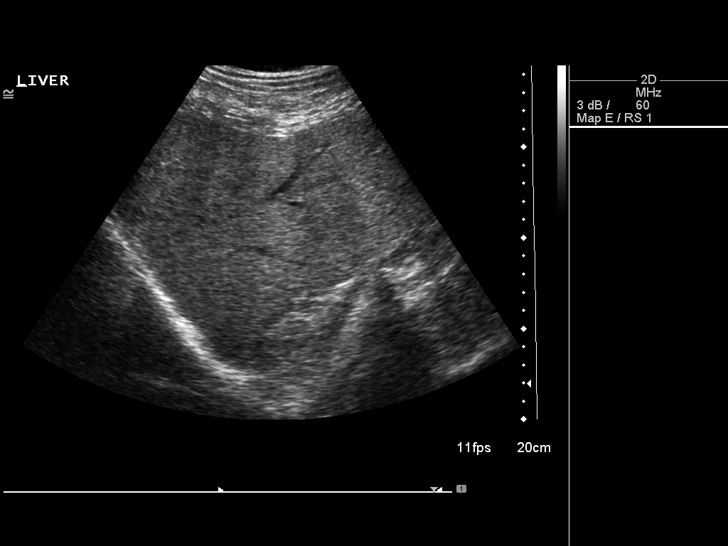
[im 39/93]
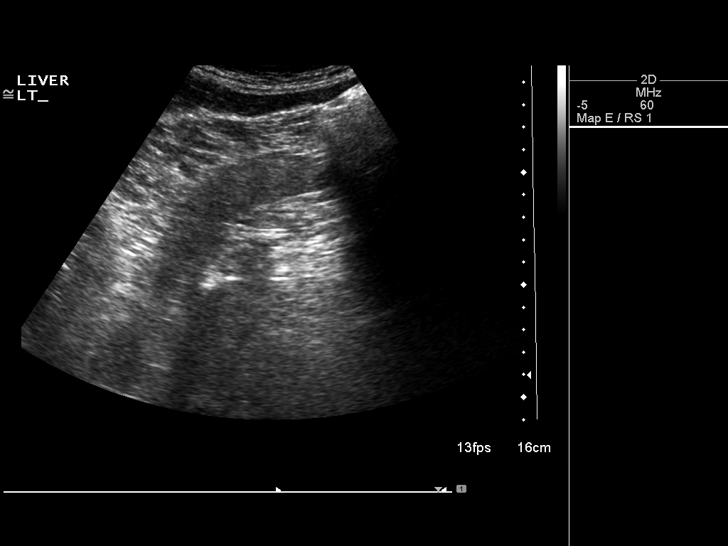
[im 47/93]
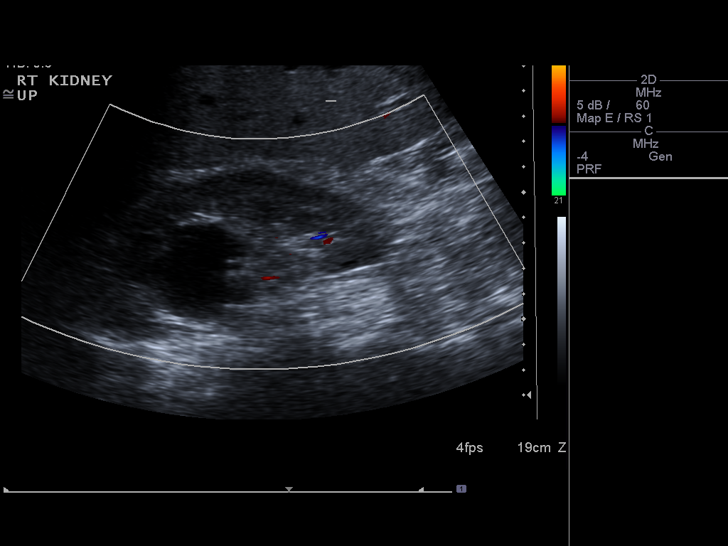
[im 54/93]
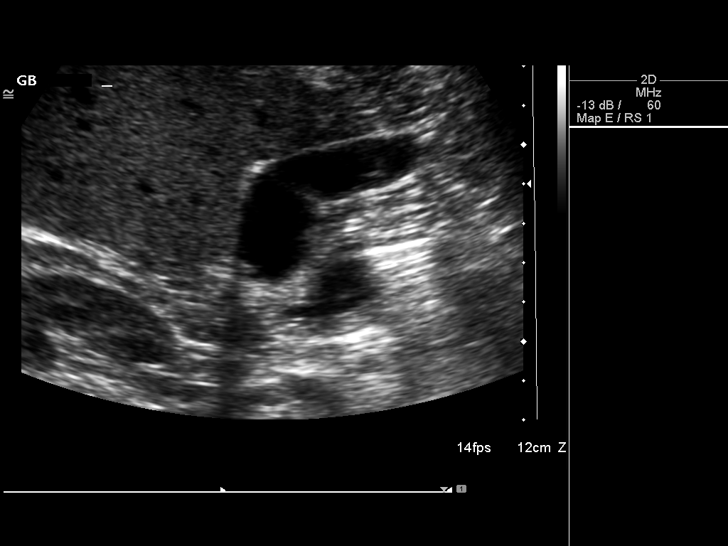
[im 62/93]
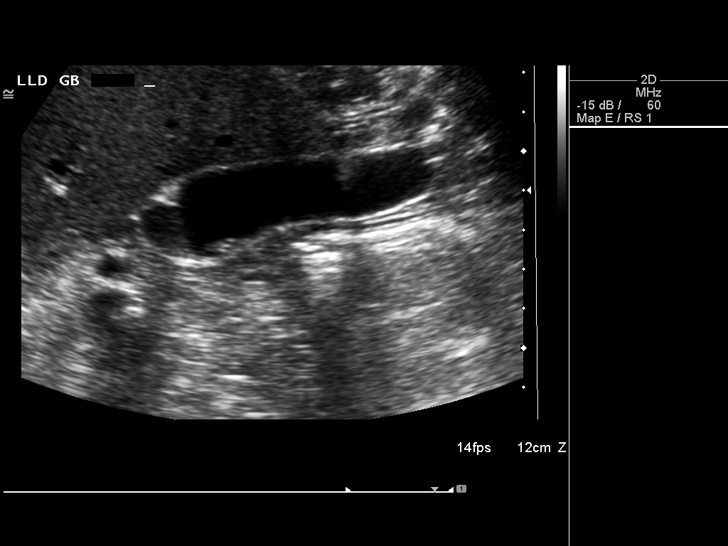
[im 70/93]
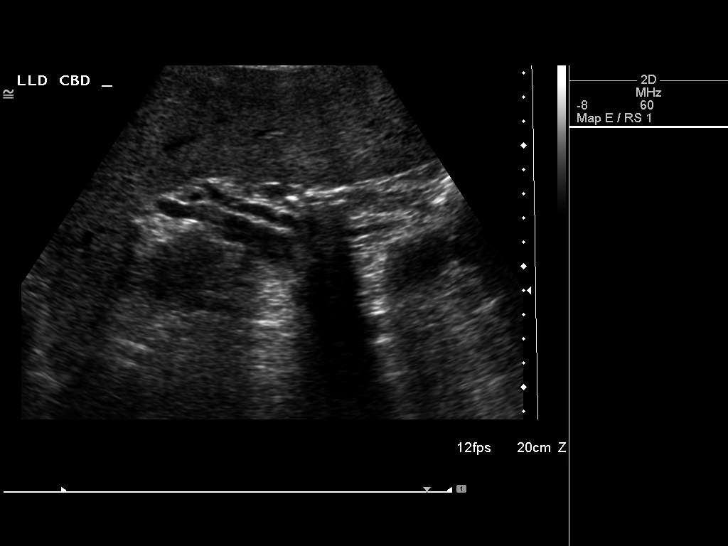
[im 77/93]
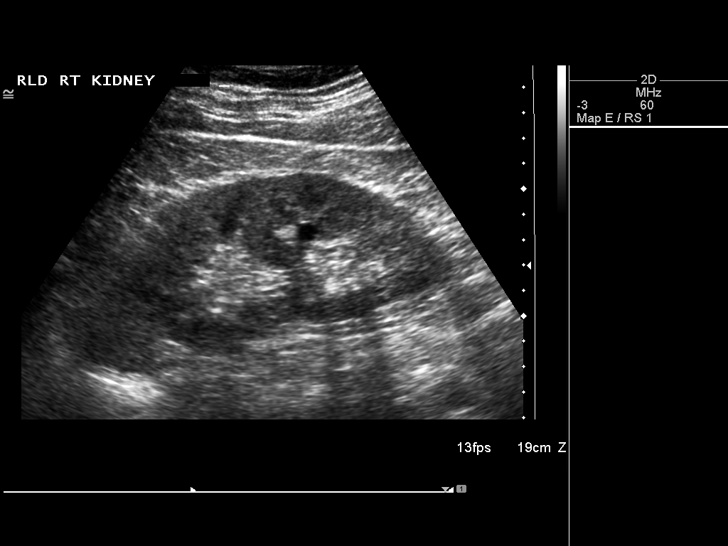
[im 85/93]
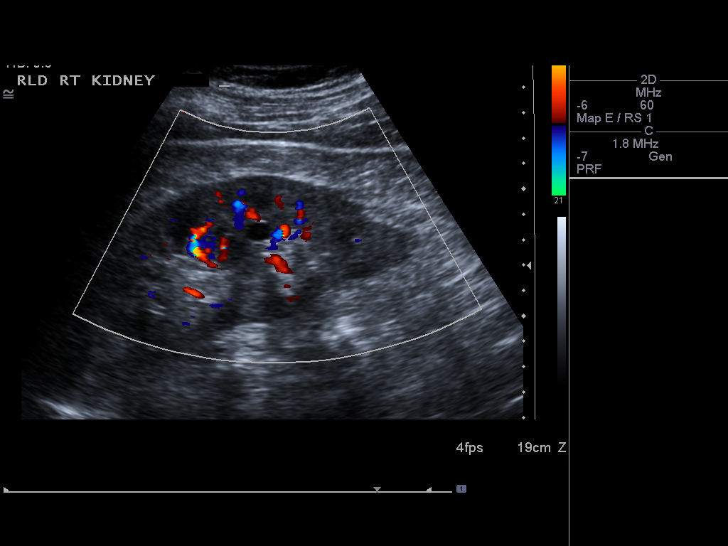
[im 93/93]
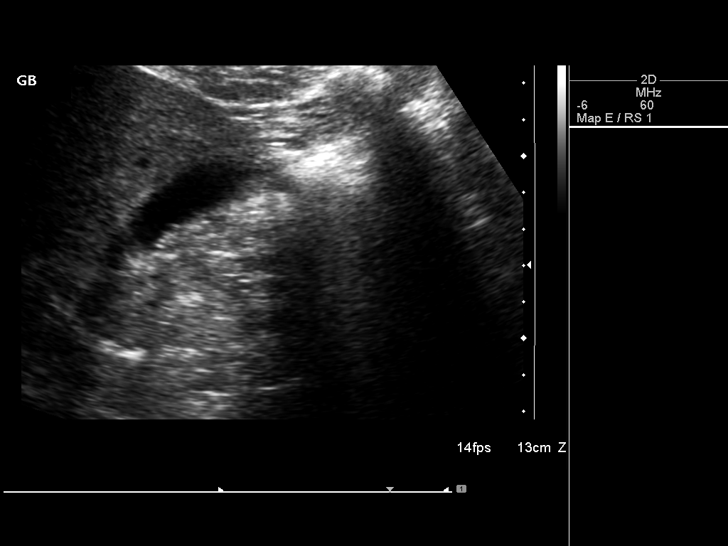

[13 of 25 positions shown; findings below may reference images not displayed]

FINDINGS: Gallbladder: The gallbladder is visualized and L1-2 echogenic foci
near the neck of the gallbladder which do not appear to change
position and new not shadow measuring 6 and 7 mm in diameter. Most
likely these represent gallbladder polyps, although noncalcified
gallstones are difficult to exclude. No pain is present over the
gallbladder with compression.

Common bile duct: Diameter: The common bile duct is within normal
limits measuring 6.3 mm.

Liver: The liver is somewhat echogenic suggesting mild fatty
infiltration. No focal hepatic abnormality is seen.

IVC: No abnormality visualized.

Pancreas: Visualized portion unremarkable.

Spleen: The spleen is normal measuring 6.3 cm.

Right Kidney: Length: 12.0 cm.. A hypoechoic structure is noted in
the upper pole most consistent with cyst of 3.9 x 2.5 x 2.3 cm.

Left Kidney: Length: 13.0 cm.. A small hypoechoic structure is noted
in the midportion of 8 mm in diameter.

Abdominal aorta: The abdominal aorta is normal in caliber.

Other findings: .
IMPRESSION: 1. Two adjacent echogenic foci in the neck of the gallbladder
without shadowing may represent small gallbladder polyps versus
noncalcified gallstones. These echogenic foci do not appear to be
mobile and therefore polyps would be favored. There is no pain over
the gallbladder.
2. Somewhat echogenic liver parenchyma may indicate fatty
infiltration.
3. Probable renal cysts, right larger than left.

## 2017-08-05 IMAGING — CT CT ABD-PELV W/ CM
2 of 5 series · 15 of 46 positions shown, 17 images · IV contrast (APPLIED)
Comparison: Ultrasound of the right upper quadrant 11/06/2015 and
CT scan of the chest 09/11/2005

CLINICAL DATA: Abnormal new gallbladder ultrasound

EXAM:
CT ABDOMEN AND PELVIS WITH CONTRAST
TECHNIQUE: Multidetector CT imaging of the abdomen and pelvis was performed
using the standard protocol following bolus administration of
intravenous contrast.
CONTRAST:  125mL OH2OFY-NPP IOPAMIDOL (OH2OFY-NPP) INJECTION 61%

[Series 2: abd/pelvis w/cm · axial · 0.82mm/px · z∈[-489,-79]mm · 12 of 92 slices shown, 14 images]
[im 5/92  soft-tissue]
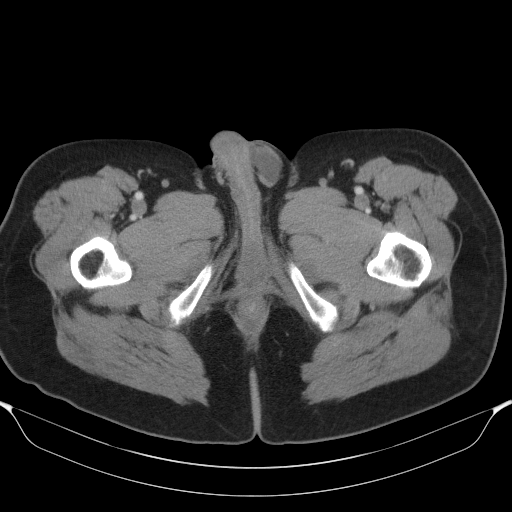
[im 5/92  bone]
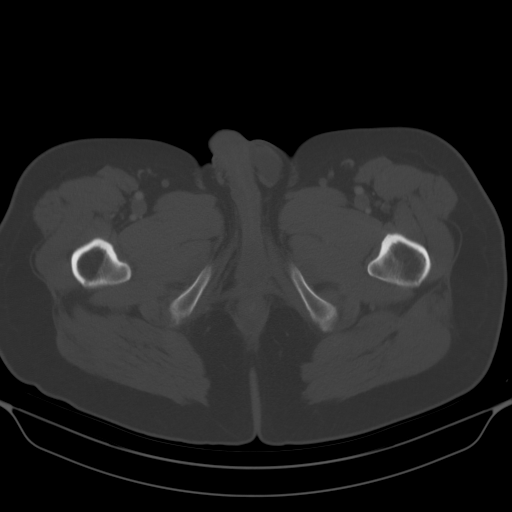
[im 14/92  soft-tissue]
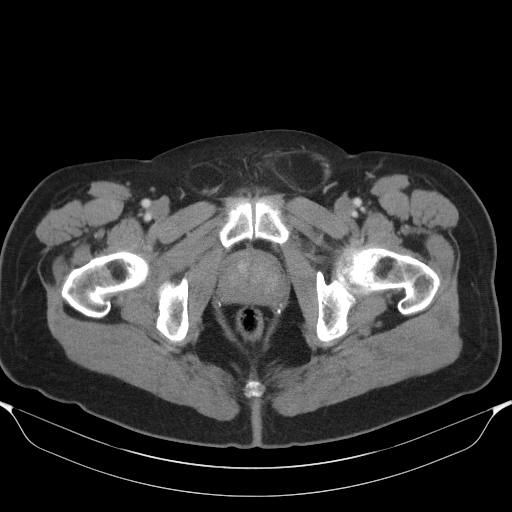
[im 19/92  soft-tissue]
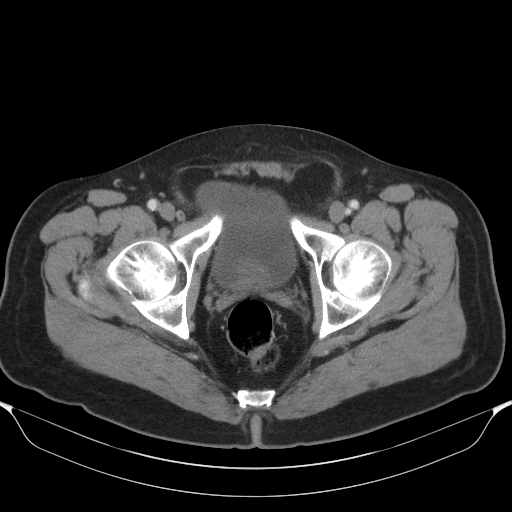
[im 28/92  soft-tissue]
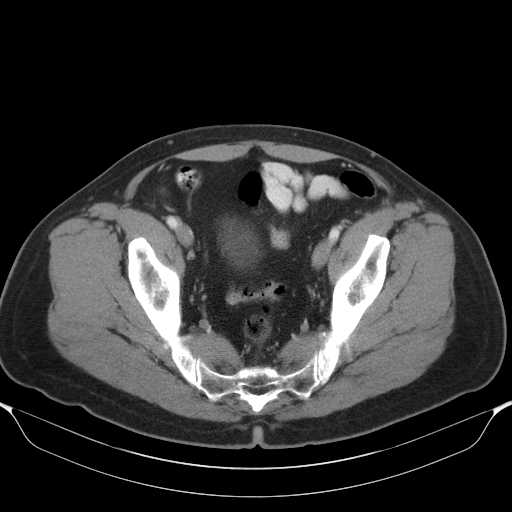
[im 37/92  soft-tissue]
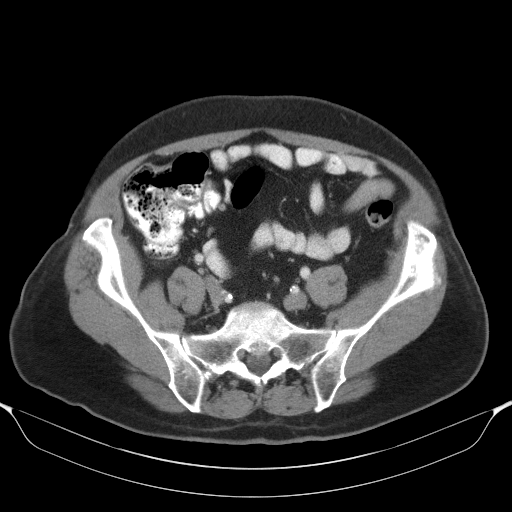
[im 41/92  soft-tissue]
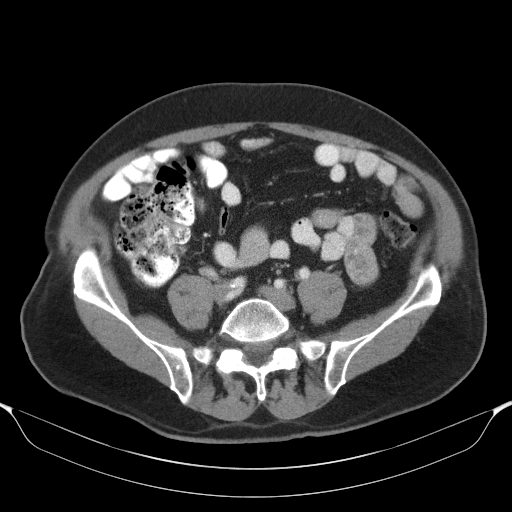
[im 51/92  soft-tissue]
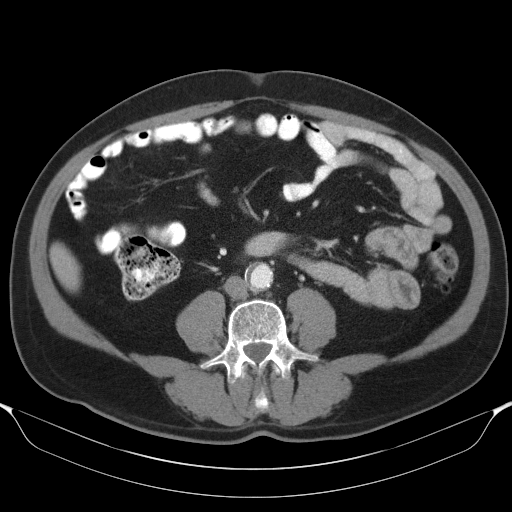
[im 55/92  soft-tissue]
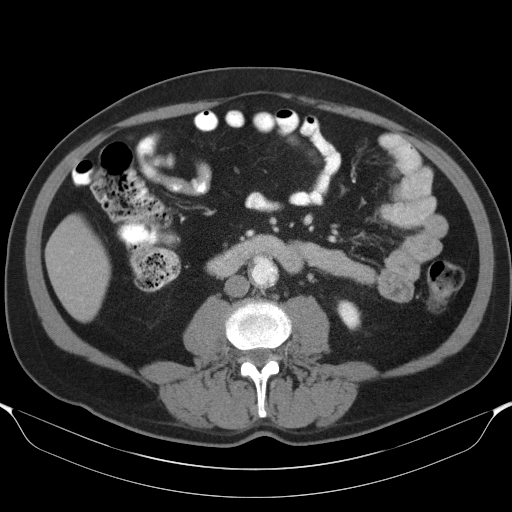
[im 64/92  soft-tissue]
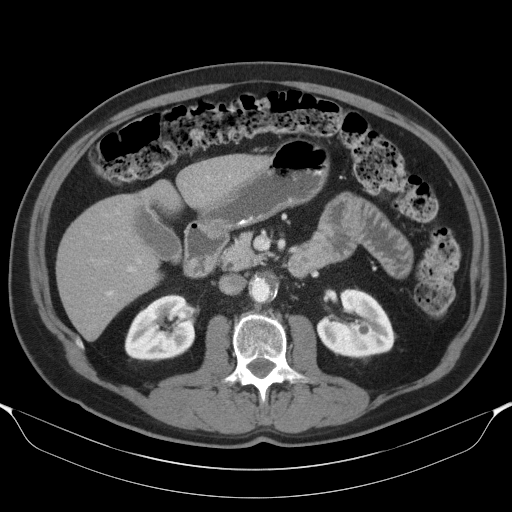
[im 64/92  bone]
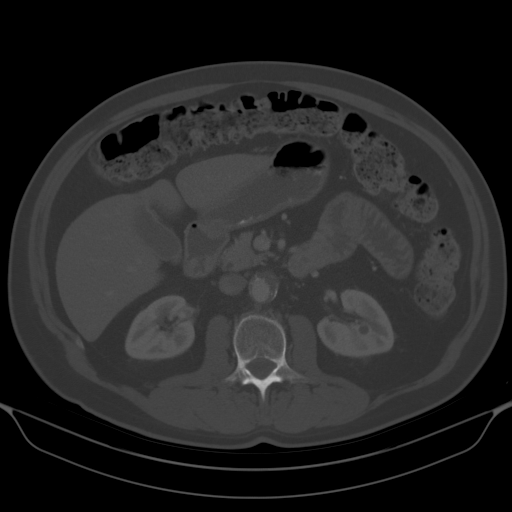
[im 73/92  soft-tissue]
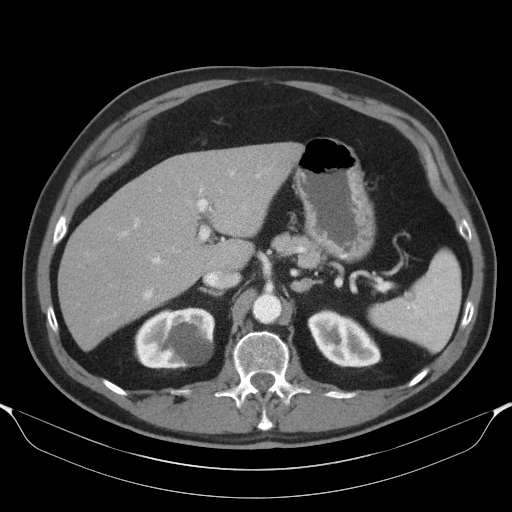
[im 78/92  soft-tissue]
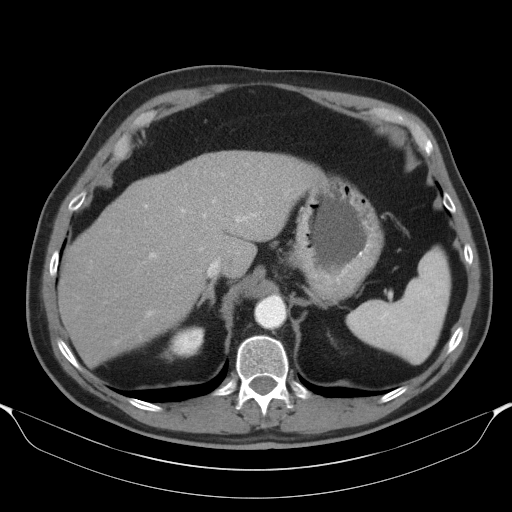
[im 87/92  soft-tissue]
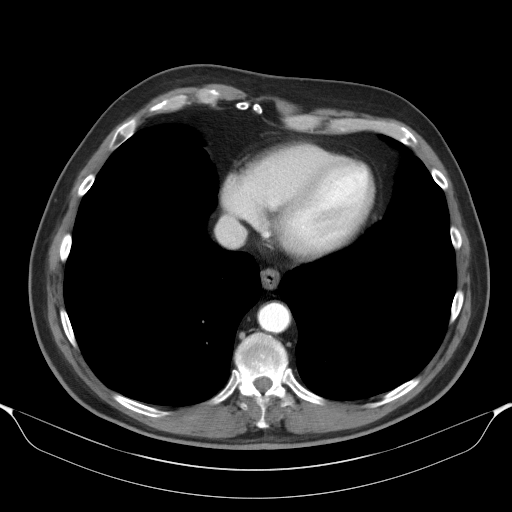

[Series 3: cor · coronal · 0.85mm/px · 3 of 98 slices shown]
[im 33/98  soft-tissue]
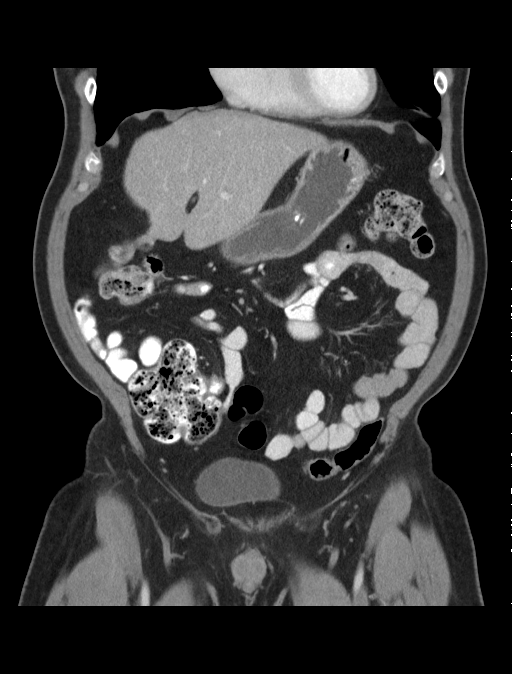
[im 44/98  soft-tissue]
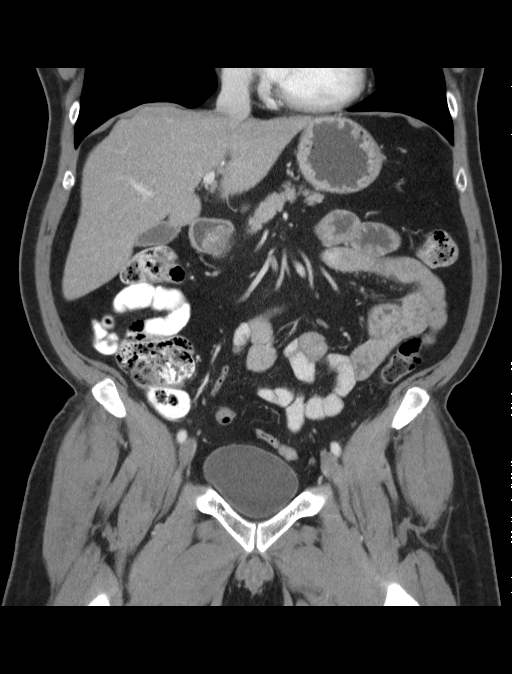
[im 54/98  soft-tissue]
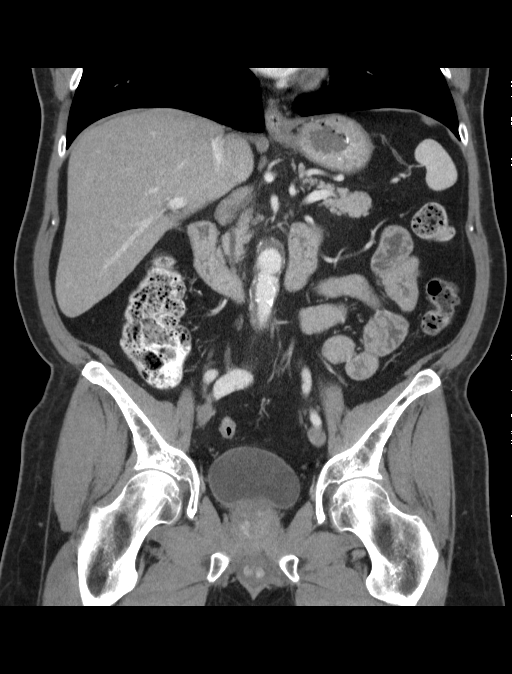

[15 of 46 positions shown; findings below may reference images not displayed]

FINDINGS: Lung bases are unremarkable. Sagittal images of the spine shows mild
degenerative changes lumbar spine. There is fatty infiltration of
the liver. No focal hepatic mass. No intrahepatic biliary ductal
dilatation. Gallbladder is not distended. No pericholecystic fluid.
No calcified gallstones are noted within gallbladder.

CBD measures 6 mm in diameter within normal limits.

The pancreas, spleen and right adrenal gland is unremarkable. Left
adrenal nodule again noted measures 1.1 cm stable in size in
appearance probable benign in nature.

Atherosclerotic calcifications and atherosclerotic plaques are noted
within abdominal aorta and iliac arteries. There is infrarenal
abdominal aortic aneurysm measures 3.2 cm in diameter.

Kidneys are symmetrical in size and enhancement. There is a cyst in
midpole of the right kidney measures 3.8 cm.

The delayed renal images shows bilateral renal symmetrical
excretion. No hydronephrosis or hydroureter. Bilateral visualized
proximal ureter is unremarkable.

There is no small bowel obstruction. Moderate stool noted in right
colon transverse colon and descending colon. Normal appendix clearly
visualize in axial image 53. The terminal ileum is unremarkable.

No ascites or free air. No adenopathy. The urinary bladder is
unremarkable. Prostate gland measures 4.3 by 5.5 cm. There is a left
inguinal scrotal canal hernia containing fat measures 3.5 cm without
evidence of acute complication. Small left hydrocele is partially
visualized.
IMPRESSION: 1. No calcified gallstones are noted within gallbladder. No
thickening of gallbladder wall. No pericholecystic fluid. No
intrahepatic biliary ductal dilatation.
2. No hydronephrosis or hydroureter.  Right renal cyst is noted.
3. Aneurysmal dilatation of infrarenal abdominal aorta measures up
to 3.2 cm in diameter. Atherosclerotic plaques and calcifications
are noted within abdominal aorta and iliac arteries.
4. Moderate stool noted in right colon transverse colon and
descending colon. No pericecal inflammation. Normal appendix.
5. No small bowel obstruction.
6. There is a left inguinal scrotal canal hernia containing fat
measures 3.5 cm. Small left hydrocele.
7. Stable left adrenal nodule measures 1.1 cm probable benign in
nature.
